# Patient Record
Sex: Female | Born: 1935 | Race: Black or African American | Hispanic: No | Smoking: Never smoker
Health system: Southern US, Community
[De-identification: ages and names within clinical notes are randomized; demographics above are authoritative.]

## PROBLEM LIST (undated history)

## (undated) DIAGNOSIS — M1990 Unspecified osteoarthritis, unspecified site: Secondary | ICD-10-CM

## (undated) DIAGNOSIS — I10 Essential (primary) hypertension: Secondary | ICD-10-CM

## (undated) DIAGNOSIS — E669 Obesity, unspecified: Secondary | ICD-10-CM

## (undated) DIAGNOSIS — D561 Beta thalassemia: Secondary | ICD-10-CM

## (undated) DIAGNOSIS — I639 Cerebral infarction, unspecified: Secondary | ICD-10-CM

## (undated) HISTORY — PX: CATARACT EXTRACTION: SUR2

## (undated) HISTORY — PX: APPENDECTOMY: SHX54

## (undated) HISTORY — PX: ABDOMINAL HYSTERECTOMY: SHX81

## (undated) HISTORY — PX: FOOT SURGERY: SHX648

---

## 2003-08-25 ENCOUNTER — Emergency Department (HOSPITAL_COMMUNITY): Admission: AD | Admit: 2003-08-25 | Discharge: 2003-08-25 | Payer: Self-pay | Admitting: Family Medicine

## 2007-05-11 ENCOUNTER — Encounter: Admission: RE | Admit: 2007-05-11 | Discharge: 2007-05-11 | Payer: Self-pay | Admitting: *Deleted

## 2007-05-15 ENCOUNTER — Encounter: Admission: RE | Admit: 2007-05-15 | Discharge: 2007-05-15 | Payer: Self-pay | Admitting: *Deleted

## 2008-06-13 ENCOUNTER — Encounter: Admission: RE | Admit: 2008-06-13 | Discharge: 2008-06-13 | Payer: Self-pay | Admitting: Family Medicine

## 2011-07-29 ENCOUNTER — Ambulatory Visit
Admission: RE | Admit: 2011-07-29 | Discharge: 2011-07-29 | Disposition: A | Discharge status: Home / Self Care | Payer: Medicare HMO | Source: Ambulatory Visit | Attending: Podiatry | Admitting: Podiatry

## 2011-07-29 ENCOUNTER — Other Ambulatory Visit: Payer: Self-pay | Admitting: Podiatry

## 2011-07-29 DIAGNOSIS — M79673 Pain in unspecified foot: Secondary | ICD-10-CM

## 2011-08-08 DIAGNOSIS — Z0000 Encounter for general adult medical examination without abnormal findings: Secondary | ICD-10-CM | POA: Insufficient documentation

## 2013-04-17 ENCOUNTER — Other Ambulatory Visit: Payer: Self-pay | Admitting: Family Medicine

## 2013-04-17 ENCOUNTER — Ambulatory Visit
Admission: RE | Admit: 2013-04-17 | Discharge: 2013-04-17 | Disposition: A | Discharge status: Home / Self Care | Payer: Medicare HMO | Source: Ambulatory Visit | Attending: Family Medicine | Admitting: Family Medicine

## 2013-04-17 DIAGNOSIS — R05 Cough: Secondary | ICD-10-CM

## 2013-04-18 ENCOUNTER — Other Ambulatory Visit: Payer: Self-pay | Admitting: Family Medicine

## 2013-04-18 DIAGNOSIS — R9389 Abnormal findings on diagnostic imaging of other specified body structures: Secondary | ICD-10-CM

## 2013-04-23 ENCOUNTER — Ambulatory Visit
Admission: RE | Admit: 2013-04-23 | Discharge: 2013-04-23 | Disposition: A | Discharge status: Home / Self Care | Payer: Medicare HMO | Source: Ambulatory Visit | Attending: Family Medicine | Admitting: Family Medicine

## 2013-04-23 DIAGNOSIS — R9389 Abnormal findings on diagnostic imaging of other specified body structures: Secondary | ICD-10-CM

## 2013-04-23 MED ORDER — IOHEXOL 300 MG/ML  SOLN
75.0000 mL | Freq: Once | INTRAMUSCULAR | Status: AC | PRN
  Administered 2013-04-23: 75 mL via INTRAVENOUS

## 2014-04-07 ENCOUNTER — Other Ambulatory Visit: Payer: Self-pay | Admitting: Family Medicine

## 2014-04-07 DIAGNOSIS — R911 Solitary pulmonary nodule: Secondary | ICD-10-CM

## 2014-04-24 ENCOUNTER — Other Ambulatory Visit: Payer: Medicare HMO

## 2014-05-01 ENCOUNTER — Ambulatory Visit
Admission: RE | Admit: 2014-05-01 | Discharge: 2014-05-01 | Disposition: A | Discharge status: Home / Self Care | Payer: Commercial Managed Care - HMO | Source: Ambulatory Visit | Attending: Family Medicine | Admitting: Family Medicine

## 2014-05-01 DIAGNOSIS — R911 Solitary pulmonary nodule: Secondary | ICD-10-CM

## 2014-05-06 ENCOUNTER — Other Ambulatory Visit: Payer: Self-pay | Admitting: Family Medicine

## 2014-05-06 DIAGNOSIS — E041 Nontoxic single thyroid nodule: Secondary | ICD-10-CM

## 2014-05-12 ENCOUNTER — Ambulatory Visit
Admission: RE | Admit: 2014-05-12 | Discharge: 2014-05-12 | Disposition: A | Discharge status: Home / Self Care | Payer: Commercial Managed Care - HMO | Source: Ambulatory Visit | Attending: Family Medicine | Admitting: Family Medicine

## 2014-05-12 DIAGNOSIS — E041 Nontoxic single thyroid nodule: Secondary | ICD-10-CM

## 2014-05-13 ENCOUNTER — Other Ambulatory Visit: Payer: Self-pay | Admitting: Family Medicine

## 2014-05-13 DIAGNOSIS — E041 Nontoxic single thyroid nodule: Secondary | ICD-10-CM

## 2014-05-21 ENCOUNTER — Ambulatory Visit
Admission: RE | Admit: 2014-05-21 | Discharge: 2014-05-21 | Disposition: A | Discharge status: Home / Self Care | Payer: Commercial Managed Care - HMO | Source: Ambulatory Visit | Attending: Family Medicine | Admitting: Family Medicine

## 2014-05-21 ENCOUNTER — Other Ambulatory Visit: Payer: Self-pay | Admitting: Family Medicine

## 2014-05-21 DIAGNOSIS — E041 Nontoxic single thyroid nodule: Secondary | ICD-10-CM

## 2014-07-09 ENCOUNTER — Other Ambulatory Visit: Payer: Self-pay

## 2014-07-09 DIAGNOSIS — Z1231 Encounter for screening mammogram for malignant neoplasm of breast: Secondary | ICD-10-CM

## 2014-08-05 ENCOUNTER — Ambulatory Visit
Admission: RE | Admit: 2014-08-05 | Discharge: 2014-08-05 | Disposition: A | Discharge status: Home / Self Care | Payer: Commercial Managed Care - HMO | Source: Ambulatory Visit

## 2014-08-05 DIAGNOSIS — Z1231 Encounter for screening mammogram for malignant neoplasm of breast: Secondary | ICD-10-CM

## 2014-09-02 DIAGNOSIS — N3941 Urge incontinence: Secondary | ICD-10-CM | POA: Diagnosis not present

## 2014-09-02 DIAGNOSIS — B373 Candidiasis of vulva and vagina: Secondary | ICD-10-CM | POA: Diagnosis not present

## 2014-09-02 DIAGNOSIS — N907 Vulvar cyst: Secondary | ICD-10-CM | POA: Diagnosis not present

## 2014-09-02 DIAGNOSIS — N762 Acute vulvitis: Secondary | ICD-10-CM | POA: Diagnosis not present

## 2014-09-25 DIAGNOSIS — Z23 Encounter for immunization: Secondary | ICD-10-CM | POA: Diagnosis not present

## 2014-09-25 DIAGNOSIS — R7309 Other abnormal glucose: Secondary | ICD-10-CM | POA: Diagnosis not present

## 2014-09-25 DIAGNOSIS — E785 Hyperlipidemia, unspecified: Secondary | ICD-10-CM | POA: Diagnosis not present

## 2014-09-25 DIAGNOSIS — D563 Thalassemia minor: Secondary | ICD-10-CM | POA: Diagnosis not present

## 2014-09-25 DIAGNOSIS — E049 Nontoxic goiter, unspecified: Secondary | ICD-10-CM | POA: Diagnosis not present

## 2014-09-25 DIAGNOSIS — I10 Essential (primary) hypertension: Secondary | ICD-10-CM | POA: Diagnosis not present

## 2014-09-29 DIAGNOSIS — R7309 Other abnormal glucose: Secondary | ICD-10-CM | POA: Diagnosis not present

## 2014-09-29 DIAGNOSIS — D563 Thalassemia minor: Secondary | ICD-10-CM | POA: Diagnosis not present

## 2014-10-23 DIAGNOSIS — S60012A Contusion of left thumb without damage to nail, initial encounter: Secondary | ICD-10-CM | POA: Diagnosis not present

## 2014-11-04 DIAGNOSIS — H5210 Myopia, unspecified eye: Secondary | ICD-10-CM | POA: Diagnosis not present

## 2014-12-04 DIAGNOSIS — H2511 Age-related nuclear cataract, right eye: Secondary | ICD-10-CM | POA: Diagnosis not present

## 2014-12-04 DIAGNOSIS — H02839 Dermatochalasis of unspecified eye, unspecified eyelid: Secondary | ICD-10-CM | POA: Diagnosis not present

## 2014-12-04 DIAGNOSIS — H18412 Arcus senilis, left eye: Secondary | ICD-10-CM | POA: Diagnosis not present

## 2014-12-04 DIAGNOSIS — H18411 Arcus senilis, right eye: Secondary | ICD-10-CM | POA: Diagnosis not present

## 2015-04-20 DIAGNOSIS — E785 Hyperlipidemia, unspecified: Secondary | ICD-10-CM | POA: Diagnosis not present

## 2015-04-20 DIAGNOSIS — R7309 Other abnormal glucose: Secondary | ICD-10-CM | POA: Diagnosis not present

## 2015-04-20 DIAGNOSIS — I10 Essential (primary) hypertension: Secondary | ICD-10-CM | POA: Diagnosis not present

## 2015-04-20 DIAGNOSIS — E049 Nontoxic goiter, unspecified: Secondary | ICD-10-CM | POA: Diagnosis not present

## 2015-04-20 DIAGNOSIS — Z0000 Encounter for general adult medical examination without abnormal findings: Secondary | ICD-10-CM | POA: Diagnosis not present

## 2015-04-20 DIAGNOSIS — M25562 Pain in left knee: Secondary | ICD-10-CM | POA: Diagnosis not present

## 2015-04-20 DIAGNOSIS — N951 Menopausal and female climacteric states: Secondary | ICD-10-CM | POA: Diagnosis not present

## 2015-04-21 ENCOUNTER — Other Ambulatory Visit: Payer: Self-pay | Admitting: Family Medicine

## 2015-04-21 DIAGNOSIS — E049 Nontoxic goiter, unspecified: Secondary | ICD-10-CM

## 2015-04-23 DIAGNOSIS — H18412 Arcus senilis, left eye: Secondary | ICD-10-CM | POA: Diagnosis not present

## 2015-04-23 DIAGNOSIS — H2511 Age-related nuclear cataract, right eye: Secondary | ICD-10-CM | POA: Diagnosis not present

## 2015-04-23 DIAGNOSIS — H18411 Arcus senilis, right eye: Secondary | ICD-10-CM | POA: Diagnosis not present

## 2015-04-23 DIAGNOSIS — H02839 Dermatochalasis of unspecified eye, unspecified eyelid: Secondary | ICD-10-CM | POA: Diagnosis not present

## 2015-04-30 ENCOUNTER — Other Ambulatory Visit: Payer: Commercial Managed Care - HMO

## 2015-05-19 DIAGNOSIS — M8589 Other specified disorders of bone density and structure, multiple sites: Secondary | ICD-10-CM | POA: Diagnosis not present

## 2015-05-19 DIAGNOSIS — Z780 Asymptomatic menopausal state: Secondary | ICD-10-CM | POA: Diagnosis not present

## 2015-06-10 DIAGNOSIS — H2511 Age-related nuclear cataract, right eye: Secondary | ICD-10-CM | POA: Diagnosis not present

## 2015-06-10 DIAGNOSIS — H269 Unspecified cataract: Secondary | ICD-10-CM | POA: Diagnosis not present

## 2015-06-10 DIAGNOSIS — I10 Essential (primary) hypertension: Secondary | ICD-10-CM | POA: Diagnosis not present

## 2015-06-10 DIAGNOSIS — Z886 Allergy status to analgesic agent status: Secondary | ICD-10-CM | POA: Diagnosis not present

## 2015-06-10 DIAGNOSIS — Z961 Presence of intraocular lens: Secondary | ICD-10-CM | POA: Diagnosis not present

## 2015-06-10 DIAGNOSIS — Z6839 Body mass index (BMI) 39.0-39.9, adult: Secondary | ICD-10-CM | POA: Diagnosis not present

## 2015-06-10 DIAGNOSIS — Z87891 Personal history of nicotine dependence: Secondary | ICD-10-CM | POA: Diagnosis not present

## 2015-06-10 DIAGNOSIS — E669 Obesity, unspecified: Secondary | ICD-10-CM | POA: Diagnosis not present

## 2015-06-11 DIAGNOSIS — H2512 Age-related nuclear cataract, left eye: Secondary | ICD-10-CM | POA: Diagnosis not present

## 2015-06-18 DIAGNOSIS — H2510 Age-related nuclear cataract, unspecified eye: Secondary | ICD-10-CM | POA: Insufficient documentation

## 2015-06-24 DIAGNOSIS — Z886 Allergy status to analgesic agent status: Secondary | ICD-10-CM | POA: Diagnosis not present

## 2015-06-24 DIAGNOSIS — Z87891 Personal history of nicotine dependence: Secondary | ICD-10-CM | POA: Diagnosis not present

## 2015-06-24 DIAGNOSIS — Z79899 Other long term (current) drug therapy: Secondary | ICD-10-CM | POA: Diagnosis not present

## 2015-06-24 DIAGNOSIS — E669 Obesity, unspecified: Secondary | ICD-10-CM | POA: Diagnosis not present

## 2015-06-24 DIAGNOSIS — H2512 Age-related nuclear cataract, left eye: Secondary | ICD-10-CM | POA: Diagnosis not present

## 2015-06-24 DIAGNOSIS — H269 Unspecified cataract: Secondary | ICD-10-CM | POA: Diagnosis not present

## 2015-06-24 DIAGNOSIS — E785 Hyperlipidemia, unspecified: Secondary | ICD-10-CM | POA: Diagnosis not present

## 2015-06-24 DIAGNOSIS — Z961 Presence of intraocular lens: Secondary | ICD-10-CM | POA: Diagnosis not present

## 2015-06-24 DIAGNOSIS — Z6841 Body Mass Index (BMI) 40.0 and over, adult: Secondary | ICD-10-CM | POA: Diagnosis not present

## 2015-06-24 DIAGNOSIS — I10 Essential (primary) hypertension: Secondary | ICD-10-CM | POA: Diagnosis not present

## 2015-07-03 DIAGNOSIS — M1712 Unilateral primary osteoarthritis, left knee: Secondary | ICD-10-CM | POA: Diagnosis not present

## 2015-07-07 ENCOUNTER — Ambulatory Visit
Admission: RE | Admit: 2015-07-07 | Discharge: 2015-07-07 | Disposition: A | Discharge status: Home / Self Care | Payer: Commercial Managed Care - HMO | Source: Ambulatory Visit | Attending: Family Medicine | Admitting: Family Medicine

## 2015-07-07 DIAGNOSIS — E041 Nontoxic single thyroid nodule: Secondary | ICD-10-CM | POA: Diagnosis not present

## 2015-07-07 DIAGNOSIS — E049 Nontoxic goiter, unspecified: Secondary | ICD-10-CM

## 2015-09-17 DIAGNOSIS — M79674 Pain in right toe(s): Secondary | ICD-10-CM | POA: Diagnosis not present

## 2015-09-17 DIAGNOSIS — L03031 Cellulitis of right toe: Secondary | ICD-10-CM | POA: Diagnosis not present

## 2015-09-23 ENCOUNTER — Other Ambulatory Visit: Payer: Self-pay | Admitting: Obstetrics and Gynecology

## 2015-09-23 ENCOUNTER — Other Ambulatory Visit: Payer: Self-pay

## 2015-09-23 DIAGNOSIS — Z1231 Encounter for screening mammogram for malignant neoplasm of breast: Secondary | ICD-10-CM

## 2015-10-02 DIAGNOSIS — B351 Tinea unguium: Secondary | ICD-10-CM | POA: Diagnosis not present

## 2015-10-02 DIAGNOSIS — M79675 Pain in left toe(s): Secondary | ICD-10-CM | POA: Diagnosis not present

## 2015-10-02 DIAGNOSIS — M79674 Pain in right toe(s): Secondary | ICD-10-CM | POA: Diagnosis not present

## 2015-10-29 ENCOUNTER — Ambulatory Visit: Payer: Commercial Managed Care - HMO

## 2015-11-11 DIAGNOSIS — E785 Hyperlipidemia, unspecified: Secondary | ICD-10-CM | POA: Diagnosis not present

## 2015-11-11 DIAGNOSIS — I10 Essential (primary) hypertension: Secondary | ICD-10-CM | POA: Diagnosis not present

## 2015-11-11 DIAGNOSIS — Z23 Encounter for immunization: Secondary | ICD-10-CM | POA: Diagnosis not present

## 2015-11-11 DIAGNOSIS — R7309 Other abnormal glucose: Secondary | ICD-10-CM | POA: Diagnosis not present

## 2015-11-18 ENCOUNTER — Ambulatory Visit
Admission: RE | Admit: 2015-11-18 | Discharge: 2015-11-18 | Disposition: A | Discharge status: Home / Self Care | Payer: Commercial Managed Care - HMO | Source: Ambulatory Visit

## 2015-11-18 DIAGNOSIS — Z1231 Encounter for screening mammogram for malignant neoplasm of breast: Secondary | ICD-10-CM

## 2015-12-08 ENCOUNTER — Inpatient Hospital Stay (HOSPITAL_COMMUNITY)
Admission: EM | Admit: 2015-12-08 | Discharge: 2015-12-10 | DRG: 066 | Disposition: A | Discharge status: Home / Self Care | Payer: Commercial Managed Care - HMO | Attending: Internal Medicine | Admitting: Internal Medicine

## 2015-12-08 ENCOUNTER — Encounter (HOSPITAL_COMMUNITY): Payer: Self-pay | Admitting: *Deleted

## 2015-12-08 ENCOUNTER — Emergency Department (HOSPITAL_COMMUNITY): Payer: Commercial Managed Care - HMO

## 2015-12-08 DIAGNOSIS — R499 Unspecified voice and resonance disorder: Secondary | ICD-10-CM | POA: Diagnosis not present

## 2015-12-08 DIAGNOSIS — R29810 Facial weakness: Secondary | ICD-10-CM | POA: Diagnosis not present

## 2015-12-08 DIAGNOSIS — I739 Peripheral vascular disease, unspecified: Secondary | ICD-10-CM | POA: Diagnosis present

## 2015-12-08 DIAGNOSIS — I639 Cerebral infarction, unspecified: Secondary | ICD-10-CM | POA: Diagnosis not present

## 2015-12-08 DIAGNOSIS — E785 Hyperlipidemia, unspecified: Secondary | ICD-10-CM | POA: Diagnosis present

## 2015-12-08 DIAGNOSIS — Z6838 Body mass index (BMI) 38.0-38.9, adult: Secondary | ICD-10-CM

## 2015-12-08 DIAGNOSIS — Z87891 Personal history of nicotine dependence: Secondary | ICD-10-CM

## 2015-12-08 DIAGNOSIS — E669 Obesity, unspecified: Secondary | ICD-10-CM | POA: Diagnosis present

## 2015-12-08 DIAGNOSIS — R471 Dysarthria and anarthria: Secondary | ICD-10-CM | POA: Diagnosis present

## 2015-12-08 DIAGNOSIS — R7303 Prediabetes: Secondary | ICD-10-CM | POA: Diagnosis not present

## 2015-12-08 DIAGNOSIS — R05 Cough: Secondary | ICD-10-CM | POA: Diagnosis not present

## 2015-12-08 DIAGNOSIS — E876 Hypokalemia: Secondary | ICD-10-CM | POA: Diagnosis present

## 2015-12-08 DIAGNOSIS — Z79899 Other long term (current) drug therapy: Secondary | ICD-10-CM | POA: Diagnosis not present

## 2015-12-08 DIAGNOSIS — J811 Chronic pulmonary edema: Secondary | ICD-10-CM | POA: Diagnosis not present

## 2015-12-08 DIAGNOSIS — I638 Other cerebral infarction: Secondary | ICD-10-CM | POA: Diagnosis not present

## 2015-12-08 HISTORY — DX: Obesity, unspecified: E66.9

## 2015-12-08 LAB — COMPREHENSIVE METABOLIC PANEL
ALK PHOS: 58 U/L (ref 38–126)
ALT: 17 U/L (ref 14–54)
AST: 21 U/L (ref 15–41)
Albumin: 4.1 g/dL (ref 3.5–5.0)
Anion gap: 9 (ref 5–15)
BILIRUBIN TOTAL: 0.7 mg/dL (ref 0.3–1.2)
BUN: 16 mg/dL (ref 6–20)
CALCIUM: 9.6 mg/dL (ref 8.9–10.3)
CO2: 28 mmol/L (ref 22–32)
CREATININE: 0.68 mg/dL (ref 0.44–1.00)
Chloride: 102 mmol/L (ref 101–111)
Glucose, Bld: 102 mg/dL — ABNORMAL HIGH (ref 65–99)
Potassium: 3.7 mmol/L (ref 3.5–5.1)
Sodium: 139 mmol/L (ref 135–145)
TOTAL PROTEIN: 7.4 g/dL (ref 6.5–8.1)

## 2015-12-08 LAB — DIFFERENTIAL
BASOS PCT: 0 %
Basophils Absolute: 0 10*3/uL (ref 0.0–0.1)
Eosinophils Absolute: 0.1 10*3/uL (ref 0.0–0.7)
Eosinophils Relative: 1 %
LYMPHS PCT: 30 %
Lymphs Abs: 1.9 10*3/uL (ref 0.7–4.0)
MONOS PCT: 10 %
Monocytes Absolute: 0.6 10*3/uL (ref 0.1–1.0)
NEUTROS ABS: 3.7 10*3/uL (ref 1.7–7.7)
Neutrophils Relative %: 59 %

## 2015-12-08 LAB — CBC
HCT: 37.3 % (ref 36.0–46.0)
HEMOGLOBIN: 11.8 g/dL — AB (ref 12.0–15.0)
MCH: 19.5 pg — AB (ref 26.0–34.0)
MCHC: 31.6 g/dL (ref 30.0–36.0)
MCV: 61.6 fL — AB (ref 78.0–100.0)
Platelets: 247 10*3/uL (ref 150–400)
RBC: 6.06 MIL/uL — AB (ref 3.87–5.11)
RDW: 15.9 % — ABNORMAL HIGH (ref 11.5–15.5)
WBC: 6.3 10*3/uL (ref 4.0–10.5)

## 2015-12-08 LAB — APTT: aPTT: 28 seconds (ref 24–37)

## 2015-12-08 LAB — PROTIME-INR
INR: 1.08 (ref 0.00–1.49)
PROTHROMBIN TIME: 14.2 s (ref 11.6–15.2)

## 2015-12-08 LAB — I-STAT TROPONIN, ED: TROPONIN I, POC: 0 ng/mL (ref 0.00–0.08)

## 2015-12-08 NOTE — ED Provider Notes (Signed)
CSN: [id number]     Arrival date & time 12/08/15  1755 History   First MD Initiated Contact with Patient 12/08/15 2018     Chief Complaint  Patient presents with  . Aphasia     (Consider location/radiation/quality/duration/timing/severity/associated sxs/prior Treatment) HPI Patient presents to the emergency department with difficulty speaking and slurred speech yesterday morning.  The patient states that she felt like she had some thickness in her throat and family noticed that she was slurring her speech.  The patient states she has no difficulty ambulating and no weakness in her extremities, states nothing seems make the condition better or worse. The patient denies chest pain, shortness of breath, headache,blurred vision, neck pain, fever, cough, weakness, numbness, dizziness, anorexia, edema, abdominal pain, nausea, vomiting, diarrhea, rash, back pain, dysuria, hematemesis, bloody stool, near syncope, or syncope.  The patient states she did not take any medications prior to arrival for her symptoms Past Medical History  Diagnosis Date  . Obesity    Past Surgical History  Procedure Laterality Date  . Appendectomy    . Abdominal hysterectomy    . Foot surgery     No family history on file. Social History  Substance Use Topics  . Smoking status: Never Smoker   . Smokeless tobacco: None  . Alcohol Use: No   OB History    No data available     Review of Systems All other systems negative except as documented in the HPI. All pertinent positives and negatives as reviewed in the HPI.   Allergies  Aspirin  Home Medications   Prior to Admission medications   Medication Sig Start Date End Date Taking? Authorizing Provider  guaiFENesin (ROBITUSSIN) 100 MG/5ML liquid Take 200 mg by mouth 3 (three) times daily as needed for cough or congestion.   Yes Historical Provider, MD  Multiple Vitamin (THERA) TABS Take 1 tablet by mouth daily.   Yes Historical Provider, MD  simvastatin  (ZOCOR) 20 MG tablet Take 20 mg by mouth daily. 11/12/15  Yes Historical Provider, MD  valsartan-hydrochlorothiazide (DIOVAN-HCT) 320-25 MG tablet Take 1 tablet by mouth daily. 11/12/15  Yes Historical Provider, MD   BP 158/90 mmHg  Pulse 73  Temp(Src) 98 F (36.7 C) (Oral)  Resp 20  Ht 5\' 4"  (1.626 m)  Wt 101.152 kg  BMI 38.26 kg/m2  SpO2 97% Physical Exam  Constitutional: She is oriented to person, place, and time. She appears well-developed and well-nourished. No distress.  HENT:  Head: Normocephalic and atraumatic.  Mouth/Throat: Oropharynx is clear and moist.  Eyes: Pupils are equal, round, and reactive to light.  Neck: Normal range of motion. Neck supple.  Cardiovascular: Normal rate, regular rhythm and normal heart sounds.  Exam reveals no gallop and no friction rub.   No murmur heard. Pulmonary/Chest: Effort normal and breath sounds normal. No respiratory distress. She has no wheezes.  Abdominal: Soft. Bowel sounds are normal. She exhibits no distension. There is no tenderness.  Neurological: She is alert and oriented to person, place, and time. She has normal strength. No sensory deficit. She exhibits normal muscle tone. Coordination and gait normal. GCS eye subscore is 4. GCS verbal subscore is 5. GCS motor subscore is 6.  Patient has left facial droop at the corner of her left mouth  Skin: Skin is warm and dry. No rash noted. No erythema.  Psychiatric: She has a normal mood and affect. Her behavior is normal.  Nursing note and vitals reviewed.   ED Course  Procedures (  including critical care time) Labs Review Labs Reviewed  CBC - Abnormal; Notable for the following:    RBC 6.06 (*)    Hemoglobin 11.8 (*)    MCV 61.6 (*)    MCH 19.5 (*)    RDW 15.9 (*)    All other components within normal limits  COMPREHENSIVE METABOLIC PANEL - Abnormal; Notable for the following:    Glucose, Bld 102 (*)    All other components within normal limits  PROTIME-INR  APTT   DIFFERENTIAL  Rosezena Sensor, ED    Imaging Review Mr Brain Wo Contrast  12/08/2015  CLINICAL DATA:  Slurred speech beginning yesterday at 11 a.m. History of obesity. EXAM: MRI HEAD WITHOUT CONTRAST TECHNIQUE: Multiplanar, multiecho pulse sequences of the brain and surrounding structures were obtained without intravenous contrast. COMPARISON:  None. FINDINGS: INTRACRANIAL CONTENTS: 13 x 6 mm focus of reduced diffusion LEFT posterior frontal lobe subcortical white matter with low ADC value, no significant FLAIR abnormality. Punctate focus of susceptibility artifact in LEFT frontal precentral gyrus is nonspecific. The ventricles and sulci are normal for patient's age. Multiple small cerebellar infarcts. Patchy supratentorial white matter FLAIR T2 hyperintensities. No suspicious parenchymal signal, mass lesions, mass effect. No abnormal extra-axial fluid collections. No extra-axial masses though, contrast enhanced sequences would be more sensitive. Major intracranial vascular flow voids present at skull base, dolichoectasia most compatible with chronic hypertension. ORBITS: The included ocular globes and orbital contents are non-suspicious. Status post bilateral ocular lens implants. Probable colobomas bilaterally. SINUSES: The mastoid air-cells and included paranasal sinuses are well-aerated. SKULL/SOFT TISSUES: No abnormal sellar expansion. No suspicious calvarial bone marrow signal. Craniocervical junction maintained. Patient is edentulous. IMPRESSION: Acute small LEFT frontal lobe infarct. Moderate white matter changes compatible with chronic small vessel ischemic disease. Multiple old small cerebellar infarcts. Electronically Signed   By: Awilda Metro M.D.   On: 12/08/2015 22:44   I have personally reviewed and evaluated these images and lab results as part of my medical decision-making.   EKG Interpretation None      The patient was noted to have slurring of her speech on examination.  She  also was noted to have some facial droop in the left corner of her mouth.  Patient be admitted to the hospital and spoke with the neuro hospitalist along with the [facility].   Charlestine Night, PA-C 12/10/15 2222  Mancel Bale, MD 12/14/15 [phone]

## 2015-12-08 NOTE — ED Notes (Addendum)
Pt taken to MRI  

## 2015-12-08 NOTE — ED Provider Notes (Signed)
  Face-to-face evaluation   History: She c/o thick feeling in throat and difficulty talking since yesterday. No HA or chest pain,  No syncope.  Physical exam: Alert elderly female. No consistant facial weakness. No dysarthria  Medical screening examination/treatment/procedure(s) were conducted as a shared visit with non-physician practitioner(s) and myself.  I personally evaluated the patient during the encounter  Mancel BaleElliott Christene Pounds, MD 12/14/15 1315

## 2015-12-08 NOTE — ED Notes (Signed)
Pt is here for slurred speech that began yesterday am around 11am.  Pt denies any other neuro symptoms.  Pt states that her face looks as it usually does.  No focal weakness or numbness.  Pt is alert and oriented, speech slurred

## 2015-12-09 ENCOUNTER — Inpatient Hospital Stay (HOSPITAL_COMMUNITY): Payer: Commercial Managed Care - HMO

## 2015-12-09 DIAGNOSIS — R7303 Prediabetes: Secondary | ICD-10-CM | POA: Diagnosis present

## 2015-12-09 DIAGNOSIS — R29810 Facial weakness: Secondary | ICD-10-CM | POA: Diagnosis present

## 2015-12-09 DIAGNOSIS — I639 Cerebral infarction, unspecified: Secondary | ICD-10-CM | POA: Diagnosis present

## 2015-12-09 DIAGNOSIS — E876 Hypokalemia: Secondary | ICD-10-CM

## 2015-12-09 DIAGNOSIS — Z79899 Other long term (current) drug therapy: Secondary | ICD-10-CM | POA: Diagnosis not present

## 2015-12-09 DIAGNOSIS — Z87891 Personal history of nicotine dependence: Secondary | ICD-10-CM | POA: Diagnosis not present

## 2015-12-09 DIAGNOSIS — R471 Dysarthria and anarthria: Secondary | ICD-10-CM

## 2015-12-09 DIAGNOSIS — E785 Hyperlipidemia, unspecified: Secondary | ICD-10-CM | POA: Diagnosis present

## 2015-12-09 DIAGNOSIS — I739 Peripheral vascular disease, unspecified: Secondary | ICD-10-CM | POA: Diagnosis present

## 2015-12-09 DIAGNOSIS — Z6838 Body mass index (BMI) 38.0-38.9, adult: Secondary | ICD-10-CM | POA: Diagnosis not present

## 2015-12-09 DIAGNOSIS — E669 Obesity, unspecified: Secondary | ICD-10-CM | POA: Diagnosis present

## 2015-12-09 LAB — CBC WITH DIFFERENTIAL/PLATELET
BASOS ABS: 0 10*3/uL (ref 0.0–0.1)
BASOS PCT: 0 %
EOS ABS: 0.1 10*3/uL (ref 0.0–0.7)
Eosinophils Relative: 1 %
HCT: 34.3 % — ABNORMAL LOW (ref 36.0–46.0)
Hemoglobin: 10.3 g/dL — ABNORMAL LOW (ref 12.0–15.0)
Lymphocytes Relative: 34 %
Lymphs Abs: 2.1 10*3/uL (ref 0.7–4.0)
MCH: 18.3 pg — AB (ref 26.0–34.0)
MCHC: 30 g/dL (ref 30.0–36.0)
MCV: 60.9 fL — ABNORMAL LOW (ref 78.0–100.0)
MONO ABS: 0.8 10*3/uL (ref 0.1–1.0)
Monocytes Relative: 12 %
NEUTROS PCT: 53 %
Neutro Abs: 3.3 10*3/uL (ref 1.7–7.7)
PLATELETS: 235 10*3/uL (ref 150–400)
RBC: 5.63 MIL/uL — AB (ref 3.87–5.11)
RDW: 15.7 % — ABNORMAL HIGH (ref 11.5–15.5)
WBC: 6.3 10*3/uL (ref 4.0–10.5)

## 2015-12-09 LAB — BASIC METABOLIC PANEL
Anion gap: 7 (ref 5–15)
BUN: 14 mg/dL (ref 6–20)
CO2: 31 mmol/L (ref 22–32)
Calcium: 9.1 mg/dL (ref 8.9–10.3)
Chloride: 101 mmol/L (ref 101–111)
Creatinine, Ser: 0.77 mg/dL (ref 0.44–1.00)
Glucose, Bld: 108 mg/dL — ABNORMAL HIGH (ref 65–99)
POTASSIUM: 3 mmol/L — AB (ref 3.5–5.1)
SODIUM: 139 mmol/L (ref 135–145)

## 2015-12-09 LAB — LIPID PANEL
CHOL/HDL RATIO: 3.3 ratio
CHOLESTEROL: 179 mg/dL (ref 0–200)
HDL: 55 mg/dL (ref >40)
LDL CALC: 97 mg/dL (ref 0–99)
Triglycerides: 135 mg/dL (ref <150)
VLDL: 27 mg/dL (ref 0–40)

## 2015-12-09 MED ORDER — POTASSIUM CHLORIDE CRYS ER 20 MEQ PO TBCR
20.0000 meq | EXTENDED_RELEASE_TABLET | Freq: Three times a day (TID) | ORAL | Status: AC
  Administered 2015-12-09 (×3): 20 meq via ORAL
  Filled 2015-12-09 (×3): qty 1

## 2015-12-09 MED ORDER — ENOXAPARIN SODIUM 40 MG/0.4ML ~~LOC~~ SOLN
40.0000 mg | SUBCUTANEOUS | Status: DC
  Administered 2015-12-09 – 2015-12-10 (×2): 40 mg via SUBCUTANEOUS
  Filled 2015-12-09 (×2): qty 0.4

## 2015-12-09 MED ORDER — STROKE: EARLY STAGES OF RECOVERY BOOK
Freq: Once | Status: AC
  Administered 2015-12-09: 1
  Filled 2015-12-09: qty 1

## 2015-12-09 MED ORDER — HYDRALAZINE HCL 20 MG/ML IJ SOLN: 10.0000 mg | Freq: Three times a day (TID) | INTRAMUSCULAR | Status: DC | PRN

## 2015-12-09 MED ORDER — ATORVASTATIN CALCIUM 40 MG PO TABS
40.0000 mg | ORAL_TABLET | Freq: Every day | ORAL | Status: DC
  Administered 2015-12-09: 40 mg via ORAL
  Filled 2015-12-09: qty 1

## 2015-12-09 MED ORDER — CLOPIDOGREL BISULFATE 75 MG PO TABS
75.0000 mg | ORAL_TABLET | Freq: Every day | ORAL | Status: DC
  Administered 2015-12-09 – 2015-12-10 (×3): 75 mg via ORAL
  Filled 2015-12-09 (×3): qty 1

## 2015-12-09 MED ORDER — SENNOSIDES-DOCUSATE SODIUM 8.6-50 MG PO TABS: 1.0000 | ORAL_TABLET | Freq: Every evening | ORAL | Status: DC | PRN

## 2015-12-09 NOTE — Consult Note (Addendum)
Neurology Consultation Reason for Consult: Stroke Referring Physician: Ed attending  CC: slurred speech  History is obtained from patient  HPI: Stacy Levy is a 82 y.o. female with hx of obesity and not on asa at baseline because of stomach upset.  She was speaking with her daughter 2 days ago on the phone and suddenly developed slurred speech but decided to wait.  Her speech did not improve by yesterday and she made appt with her PMD who sent her to the ED when he saw her in the office.  She denies arm or leg weakness, changes in gait, difficulty swallowing.  Denies diplopia, arm and hand clumsiness.   LKW: 3 days ago tpa given?: no  ROS: A 14 point ROS was performed and is negative except as noted in the HPI.  Past Medical History  Diagnosis Date  . Obesity     No family history on file.   Social History:  reports that she has never smoked. She does not have any smokeless tobacco history on file. She reports that she does not drink alcohol. Her drug history is not on file.   Exam: Current vital signs: BP 150/72 mmHg  Pulse 65  Temp(Src) 98 F (36.7 C) (Oral)  Resp 20  Ht 5\' 4"  (1.626 m)  Wt 101.152 kg (223 lb)  BMI 38.26 kg/m2  SpO2 98% Vital signs in last 24 hours: Temp:  [98 F (36.7 C)-98.4 F (36.9 C)] 98 F (36.7 C) (04/18 2322) Pulse Rate:  [65-73] 65 (04/19 0127) Resp:  [20] 20 (04/19 0127) BP: (150-158)/(72-90) 150/72 mmHg (04/19 0127) SpO2:  [97 %-98 %] 98 % (04/19 0127) Weight:  [101.152 kg (223 lb)] 101.152 kg (223 lb) (04/18 1811)   Physical Exam  Constitutional: Appears well-developed and well-nourished.  Psych: Affect appropriate to situation Eyes: No scleral injection HENT: No OP obstrucion Head: Normocephalic.  Cardiovascular: Normal rate and regular rhythm.  Respiratory: Effort normal and breath sounds normal to anterior ascultation GI: Soft.  No distension. There is no tenderness.  Skin: WDI  Neuro: Mental Status: Patient is awake,  alert, oriented to person, place, month, year, and situation. Patient is able to give a clear and coherent history. No signs of aphasia or neglect Cranial Nerves: II: Visual Fields are full. Pupils are equal, round, and reactive to light. III,IV, VI: EOMI without ptosis or diploplia.  V: Facial sensation is asymmetric, on right  VII: Facial movement is asymmetric on right  VIII: hearing is intact to voice X: Uvula elevates symmetrically XI: Shoulder shrug is symmetric. XII: tongue is midline without atrophy or fasciculations. She has lingual dysarthria Motor: Tone is normal. Bulk is normal. 5/5 strength was present in all four extremities. Sensory: Sensation is symmetric to light touch and temperature in the arms and legs Deep Tendon Reflexes: 2+ and symmetric in the biceps and patellae.  Plantars: Toes are downgoing bilaterally. Cerebellar: FNF and HKS are intact bilaterally   I have reviewed the images obtained: MRI brain shows a subcortical lacunar stroke.  Impression: stroke - admit for full stroke workup - this has been ordered. Since pt does not do well with asa will start plavix.  lovenox for dvt prophylaxis. Speech theprapy.  OK to aim for normotension.  Aim for LDL < 70  Recommendations: 1) as above. Stroke attending will assume care after tomorrow

## 2015-12-09 NOTE — ED Notes (Signed)
Admitting MD at bedside.

## 2015-12-09 NOTE — Progress Notes (Signed)
Progress Note   PROGRESS NOTE  Stacy Levy  ZOX:[id number] DOB: July 02, 1932  DOA: 12/08/2015 PCP: Cala BradfordWHITE,CYNTHIA S, MD   Outpatient Specialists:   None.   Brief Narrative:   Stacy Levy is an 82 y.o. female with a PMH of obesity who was admitted 12/08/15 with a chief complaint of speech changes. Upon initial evaluation in the ED, she was noted to have a right-sided facial droop with dysarthria. Neurology was subsequently consulted with MRI showing a subcortical lacunar stroke.  Assessment/Plan:   Principal Problem:   CVA (cerebral infarction)/stroke MRI of the brain shows a subcortical lacunar stroke. Full stroke workup initiated. Plavix started due to aspirin intolerance. Speech therapy, physical therapy and occupational therapy evaluations pending. Follow-up 2-D echo. Preliminary Doppler studies show no significant ICA stenosis. Initiate statin for LDL goal less than 70. Follow-up hemoglobin A1c.  Active Problems:   Hypokalemia We'll supplement potassium.    Obesity (BMI 30-39.9) Dietitian consultation for weight loss education.    Dyslipidemia, goal LDL below 70 Statin changed from Zocor 20 mg daily to Lipitor 40 mg daily, goal LDL 70. Current LDL 98.   Family Communication/Anticipated D/C date and plan/Code Status   DVT prophylaxis: Lovenox ordered. Code Status: Full Code.  Family Communication: Updated at the Bedside. Disposition Plan: Home in 24 hours.   Medical Consultants:    Neurology   Procedures:    Anti-Infectives:   Anti-infectives    None      Subjective:   Stacy Levy since her speech is a little better today. Still slightly slurred. No complaints of paresthesias or loss of power.  Objective:    Filed Vitals:   12/09/15 0127 12/09/15 0218 12/09/15 0430 12/09/15 0630  BP: 150/72 135/57 125/62 123/64  Pulse: 65 67 63   Temp:  98.7 F (37.1 C) 97.8 F (36.6 C) 98.4 F (36.9 C)  TempSrc:  Oral Oral Oral  Resp: 20 20 18 18     Height:  5\' 4"  (1.626 m)    Weight:  101 kg (222 lb 10.6 oz)    SpO2: 98% 95% 96% 95%    Intake/Output Summary (Last 24 hours) at 12/09/15 1232 Last data filed at 12/09/15 0832  Gross per 24 hour  Intake    240 ml  Output      0 ml  Net    240 ml   Filed Weights   12/08/15 1811 12/09/15 0218  Weight: 101.152 kg (223 lb) 101 kg (222 lb 10.6 oz)    Exam: General exam: Appears calm and comfortable.  Respiratory system: Clear to auscultation. Respiratory effort normal. Cardiovascular system: S1 & S2 heard, RRR. No JVD, murmurs, rubs, gallops or clicks. No pedal edema. Gastrointestinal system: Abdomen is nondistended, soft and nontender. No organomegaly or masses felt. Normal bowel sounds heard. Central nervous system: Alert and oriented. No focal neurological deficits. Extremities: Symmetric 5 x 5 power. No clubbing, edema, or cyanosis. Skin: No rashes, lesions or ulcers Psychiatry: Judgement and insight appear normal. Mood & affect appropriate.   Data Reviewed:   I have personally reviewed following labs and imaging studies:  Labs: Basic Metabolic Panel:  Recent Labs Lab 12/08/15 1809 12/09/15 0519  NA 139 139  K 3.7 3.0*  CL 102 101  CO2 28 31  GLUCOSE 102* 108*  BUN 16 14  CREATININE 0.68 0.77  CALCIUM 9.6 9.1   GFR Estimated Creatinine Clearance: 63.7 mL/min (by C-G formula based on Cr of 0.77). Liver Function  Tests:  Recent Labs Lab 12/08/15 1809  AST 21  ALT 17  ALKPHOS 58  BILITOT 0.7  PROT 7.4  ALBUMIN 4.1   Coagulation profile  Recent Labs Lab 12/08/15 1809  INR 1.08    CBC:  Recent Labs Lab 12/08/15 1809 12/09/15 0519  WBC 6.3 6.3  NEUTROABS 3.7 3.3  HGB 11.8* 10.3*  HCT 37.3 34.3*  MCV 61.6* 60.9*  PLT 247 235   Hgb A1c: No results for input(s): HGBA1C in the last 72 hours. Lipid Profile:  Recent Labs  12/08/15 1809  CHOL 179  HDL 55  LDLCALC 97  TRIG 135  CHOLHDL 3.3   Microbiology No results found for this or  any previous visit (from the past 240 hour(s)).  Radiology: Dg Chest 2 View  12/09/2015  CLINICAL DATA:  Stroke yesterday. EXAM: CHEST  2 VIEW COMPARISON:  Chest CT 05/01/2014.  Chest x-ray 04/17/2013. FINDINGS: Low volume found. The lungs are clear wiithout focal pneumonia, edema, pneumothorax or pleural effusion. There is pulmonary vascular congestion without overt pulmonary edema. Cardiopericardial silhouette is at upper limits of normal for size. The visualized bony structures of the thorax are intact. Telemetry leads overlie the chest. IMPRESSION: Low volume film with vascular congestion. Electronically Signed   By: Kennith Center M.D.   On: 12/09/2015 07:47   Mr Brain Wo Contrast  12/08/2015  CLINICAL DATA:  Slurred speech beginning yesterday at 11 a.m. History of obesity. EXAM: MRI HEAD WITHOUT CONTRAST TECHNIQUE: Multiplanar, multiecho pulse sequences of the brain and surrounding structures were obtained without intravenous contrast. COMPARISON:  None. FINDINGS: INTRACRANIAL CONTENTS: 13 x 6 mm focus of reduced diffusion LEFT posterior frontal lobe subcortical white matter with low ADC value, no significant FLAIR abnormality. Punctate focus of susceptibility artifact in LEFT frontal precentral gyrus is nonspecific. The ventricles and sulci are normal for patient's age. Multiple small cerebellar infarcts. Patchy supratentorial white matter FLAIR T2 hyperintensities. No suspicious parenchymal signal, mass lesions, mass effect. No abnormal extra-axial fluid collections. No extra-axial masses though, contrast enhanced sequences would be more sensitive. Major intracranial vascular flow voids present at skull base, dolichoectasia most compatible with chronic hypertension. ORBITS: The included ocular globes and orbital contents are non-suspicious. Status post bilateral ocular lens implants. Probable colobomas bilaterally. SINUSES: The mastoid air-cells and included paranasal sinuses are well-aerated.  SKULL/SOFT TISSUES: No abnormal sellar expansion. No suspicious calvarial bone marrow signal. Craniocervical junction maintained. Patient is edentulous. IMPRESSION: Acute small LEFT frontal lobe infarct. Moderate white matter changes compatible with chronic small vessel ischemic disease. Multiple old small cerebellar infarcts. Electronically Signed   By: Awilda Metro M.D.   On: 12/08/2015 22:44    Medications:   . atorvastatin  40 mg Oral q1800  . clopidogrel  75 mg Oral Daily  . enoxaparin (LOVENOX) injection  40 mg Subcutaneous Q24H  . potassium chloride  20 mEq Oral TID   Continuous Infusions:   Time spent: 25 minutes.   LOS: 0 days   RAMA,CHRISTINA  [facility] Pager [phone]. If unable to reach me by pager, please call my cell phone at [phone].  *Please refer to [email or web address], password [email or web address] to get updated schedule on who will round on this patient, as hospitalists switch teams weekly. If 7PM-7AM, please contact night-coverage at [email or web address], password [email or web address] for any overnight needs.  12/09/2015, 12:32 PM

## 2015-12-09 NOTE — Progress Notes (Signed)
STROKE TEAM PROGRESS NOTE   HISTORY OF PRESENT ILLNESS Stacy Levy is a 82 y.o. female with hx of obesity and not on asa at baseline because of stomach upset. She was speaking with her daughter 2 days ago on the phone and suddenly developed slurred speech but decided to wait. Her speech did not improve by yesterday and she made appt with her PMD who sent her to the ED when he saw her in the office. She denies arm or leg weakness, changes in gait, difficulty swallowing. Denies diplopia, arm and hand clumsiness. She was last known well 12/05/2015. Patient was not administered IV t-PA secondary to delay in arrival. She was admitted for further evaluation and treatment.   SUBJECTIVE (INTERVAL HISTORY) No family is at the bedside.  Overall she feels her condition is stable. "I want to know why i had a stroke." Dr. Pearlean Brownie spoke at length with her about her stroke, etiology and her risk factors.    OBJECTIVE Temp:  [97.8 F (36.6 C)-98.7 F (37.1 C)] 98.4 F (36.9 C) (04/19 0630) Pulse Rate:  [63-73] 63 (04/19 0430) Cardiac Rhythm:  [-] Heart block (04/19 0700) Resp:  [18-20] 18 (04/19 0630) BP: (123-158)/(57-90) 123/64 mmHg (04/19 0630) SpO2:  [95 %-98 %] 95 % (04/19 0630) Weight:  [101 kg (222 lb 10.6 oz)-101.152 kg (223 lb)] 101 kg (222 lb 10.6 oz) (04/19 0218)  CBC:   Recent Labs Lab 12/08/15 1809 12/09/15 0519  WBC 6.3 6.3  NEUTROABS 3.7 3.3  HGB 11.8* 10.3*  HCT 37.3 34.3*  MCV 61.6* 60.9*  PLT 247 235    Basic Metabolic Panel:   Recent Labs Lab 12/08/15 1809 12/09/15 0519  NA 139 139  K 3.7 3.0*  CL 102 101  CO2 28 31  GLUCOSE 102* 108*  BUN 16 14  CREATININE 0.68 0.77  CALCIUM 9.6 9.1    Lipid Panel:     Component Value Date/Time   CHOL 179 12/08/2015 1809   TRIG 135 12/08/2015 1809   HDL 55 12/08/2015 1809   CHOLHDL 3.3 12/08/2015 1809   VLDL 27 12/08/2015 1809   LDLCALC 97 12/08/2015 1809   HgbA1c: No results found for: HGBA1C Urine Drug Screen:  No results found for: LABOPIA, COCAINSCRNUR, LABBENZ, AMPHETMU, THCU, LABBARB    IMAGING  Dg Chest 2 View 12/09/2015  Low volume film with vascular congestion.   Mr Brain Wo Contrast 12/08/2015  Acute small LEFT frontal lobe infarct. Moderate white matter changes compatible with chronic small vessel ischemic disease. Multiple old small cerebellar infarcts.   Carotid Doppler   There is 1-39% bilateral ICA stenosis. Vertebral artery flow is antegrade.     PHYSICAL EXAM Pleasant elderly lady not in distress. . Afebrile. Head is nontraumatic. Neck is supple without bruit.    Cardiac exam no murmur or gallop. Lungs are clear to auscultation. Distal pulses are well felt. Neurological Exam ;  Awake alert oriented x 3 normal speech and language. Mild left lower face asymmetry. Tongue midline. No drift. Mild diminished fine finger movements on right. Orbits left over right upper extremity. Mild right grip weakness.. Normal sensation . Normal coordination. ASSESSMENT/PLAN Stacy Levy is a 82 y.o. female with history of obesity presenting with dysarthria and R facial droop . She did not receive IV t-PA due to delay in arrival.   Stroke:  Small left frontal lobe infarct secondary to small vessel disease source  Resultant  R facial droop and dysarthria  MRI  Small  L frontal lobe infarct  TCD pending   Carotid Doppler  No significant stenosis   2D Echo  pending   LDL 97  HgbA1c pending  Lovenox 40 mg sq daily for VTE prophylaxis Diet Heart Room service appropriate?: Yes; Fluid consistency:: Thin  No antithrombotic prior to admission, now on clopidogrel 75 mg daily  Patient counseled to be compliant with her antithrombotic medications  Ongoing aggressive stroke risk factor management  Therapy recommendations:  pending    Disposition:  pending  (lives alone)  [facility] months. Order written.  Hyperlipidemia  Home meds:  ZOCOR 20  LDL  97, goal < 70  Changed to lipitor 40 mg daily  Continue statin at discharge  Other Stroke Risk Factors  Advanced age  Former smoker, quit in 1975  Obesity, Body mass index is 38.2 kg/(m^2).   Hospital day # 0  Rhoderick MoodyBIBY,SHARON  [facility] See Amion for Pager information 12/09/2015 12:21 PM  I have personally examined this patient, reviewed notes, independently viewed imaging studies, participated in medical decision making and plan of care. I have made any additions or clarifications directly to the above note. Agree with note above.  He presented with slurred speech due to left brain subcortical infarct likely due to small vessel disease. He remains at risk for neurological worsening, recurrent stroke, TIA needs ongoing stroke evaluation and aggressive risk factor modification.I had a long discussion with the patient  at the bedside and answered questions.Agree with clopidogrel for stroke prevention and strict control of lipids. Delia HeadyPramod Keyli Duross, MD Medical Director [facility] Pager: [phone] 12/09/2015 2:53 PM    To contact Stroke Continuity provider, please refer to [email or web address]. After hours, contact General Neurology

## 2015-12-09 NOTE — Care Management Note (Signed)
Case Management Note  Patient Details  Name: Stacy Levy MRN: [id number] Date of Birth: Oct 12, 1931  Subjective/Objective:                    Action/Plan: Patient was admitted with CVA. Lives at home alone. Will follow for discharge needs pending PT/OT evals and physician orders.  Expected Discharge Date:  12/11/15               Expected Discharge Plan:     In-House Referral:     Discharge planning Services     Post Acute Care Choice:    Choice offered to:     DME Arranged:    DME Agency:     HH Arranged:    HH Agency:     Status of Service:  In process, will continue to follow  Medicare Important Message Given:    Date Medicare IM Given:    Medicare IM give by:    Date Additional Medicare IM Given:    Additional Medicare Important Message give by:     If discussed at Long Length of Stay Meetings, dates discussed:    Additional Comments:  Anda KraftRobarge, Kippy Melena C, RN 12/09/2015, 10:55 AM [phone]

## 2015-12-09 NOTE — Evaluation (Signed)
Physical Therapy Evaluation Patient Details Name: Stacy Levy MRN: [id number] DOB: May 07, 1932 Today's Date: 12/09/2015   History of Present Illness  Pt is a 82 y/o F who presented w/ speech difficulty.  Imaging revealed small Lt frontal lob infarct.  Pt's PMH includes obesity, foot surgery.    Clinical Impression  Pt admitted with above diagnosis. Pt currently with functional limitations due to the deficits listed below (see PT Problem List). [name] presents w/ min instability ambulating and w/ high level balance activities which she reports is her baseline.  Her son is planning to stay w/ her at d/c. Pt will benefit from skilled PT to increase their independence and safety with mobility to allow discharge to the venue listed below.      Follow Up Recommendations Outpatient PT;Supervision for mobility/OOB    Equipment Recommendations  None recommended by PT    Recommendations for Other Services OT consult     Precautions / Restrictions Precautions Precautions: Fall Restrictions Weight Bearing Restrictions: No      Mobility  Bed Mobility Overal bed mobility: Modified Independent             General bed mobility comments: Increased time.  HOB elevated.  Transfers Overall transfer level: Needs assistance Equipment used: None Transfers: Sit to/from Stand Sit to Stand: Min guard         General transfer comment: No cues or physical assist needed.  Min instability noted.   Ambulation/Gait Ambulation/Gait assistance: Min guard Ambulation Distance (Feet): 200 Feet Assistive device: None Gait Pattern/deviations: Step-through pattern;Decreased stride length;Drifts right/left   Gait velocity interpretation: at or above normal speed for age/gender General Gait Details: Min instability w/ increased lateral sway Bil on level surface and w/ high level balance activities as documented below. (Pt reports she feels at her baseline w/ ambulation)  Stairs             Wheelchair Mobility    Modified Rankin (Stroke Patients Only) Modified Rankin (Stroke Patients Only) Pre-Morbid Rankin Score: No symptoms Modified Rankin: Moderately severe disability     Balance Overall balance assessment: Needs assistance (denies h/o falls) Sitting-balance support: No upper extremity supported;Feet supported Sitting balance-Leahy Scale: Good     Standing balance support: No upper extremity supported;During functional activity Standing balance-Leahy Scale: Fair               High level balance activites: Backward walking;Turns;Sudden stops;Head turns;Other (comment);Direction changes (stepping over object) High Level Balance Comments: Min instability w/ all high level balance activities Standardized Balance Assessment Standardized Balance Assessment : Dynamic Gait Index   Dynamic Gait Index Level Surface: Mild Impairment Gait with Horizontal Head Turns: Mild Impairment Gait with Vertical Head Turns: Mild Impairment Gait and Pivot Turn: Mild Impairment Step Over Obstacle: Mild Impairment       Pertinent Vitals/Pain Pain Assessment: No/denies pain    Home Living Family/patient expects to be discharged to:: Private residence Living Arrangements: Alone Available Help at Discharge: Family;Available 24 hours/day (son is planning to stay w/ pt at d/c) Type of Home: House Home Access: Stairs to enter Entrance Stairs-Rails: None Entrance Stairs-Number of Steps: 1 Home Layout: One level Home Equipment: None      Prior Function Level of Independence: Independent               Hand Dominance   Dominant Hand: Right    Extremity/Trunk Assessment   Upper Extremity Assessment: Overall WFL for tasks assessed (not formally assessed)  Lower Extremity Assessment: Overall WFL for tasks assessed         Communication   Communication:  (slurred speech, facial droop)  Cognition Arousal/Alertness: Awake/alert Behavior During  Therapy: WFL for tasks assessed/performed Overall Cognitive Status: Within Functional Limits for tasks assessed                      General Comments General comments (skin integrity, edema, etc.): Reviewed signs and symptoms of a stroke and what to do if any are noted    Exercises        Assessment/Plan    PT Assessment Patient needs continued PT services  PT Diagnosis Difficulty walking   PT Problem List Decreased balance;Decreased safety awareness  PT Treatment Interventions DME instruction;Gait training;Functional mobility training;Therapeutic activities;Stair training;Therapeutic exercise;Balance training;Neuromuscular re-education;Patient/family education   PT Goals (Current goals can be found in the Care Plan section) Acute Rehab PT Goals Patient Stated Goal: to go home PT Goal Formulation: With patient Time For Goal Achievement: 12/23/15 Potential to Achieve Goals: Good    Frequency Min 3X/week   Barriers to discharge Inaccessible home environment 1 step to enter home    Co-evaluation               End of Session Equipment Utilized During Treatment: Gait belt Activity Tolerance: Patient tolerated treatment well Patient left: in chair;with call bell/phone within reach;with chair alarm set Nurse Communication: Mobility status         Time: 1610-9604 PT Time Calculation (min) (ACUTE ONLY): 19 min   Charges:   PT Evaluation $PT Eval Low Complexity: 1 Procedure     PT G Codes:       Encarnacion Chu PT, DPT  Pager: [phone] Phone: [phone] 12/09/2015, 2:45 PM

## 2015-12-09 NOTE — Progress Notes (Signed)
Nutrition Education Note  RD consulted for nutrition education regarding weight loss.   82 y.o. female with a PMH of obesity who was admitted 12/08/15 with a chief complaint of speech changes; noted to have a right-sided facial droop with dysarthria.  MRI showing a subcortical lacunar stroke.  Lipid Panel     Component Value Date/Time   CHOL 179 12/08/2015 1809   TRIG 135 12/08/2015 1809   HDL 55 12/08/2015 1809   CHOLHDL 3.3 12/08/2015 1809   VLDL 27 12/08/2015 1809   LDLCALC 97 12/08/2015 1809    Pt asleep at time of visit. RD awoke patient and pt agreeable to RD visit. Pt reports understanding heart healthy diet, stating is is high in fruits and vegetables. RD began to discuss the it was also low in salt and fat and emphasized whole grains and lean protein such as chicken and fish. Pt began to fall back asleep. Pt unable to participate in education at this time.   RD provided "Stroke Nutrition Therapy" and "20 Ways to Eat More Fruits and Vegetables" handout from the [employer] of [employer]. RD name and contact information provided. Will re-attempt education at return visit.   Body mass index is 38.2 kg/(m^2). Pt meets criteria for Obesity based on current BMI. Current diet order is Heart Healthy, patient is consuming approximately 50-100% of meals at this time. Pt reports normal appetite and eating well. Labs and medications reviewed.   Dorothea Ogleeanne Willard Farquharson RD, LDN Inpatient Clinical Dietitian Pager: [phone] After Hours Pager: [phone]

## 2015-12-09 NOTE — Progress Notes (Signed)
TCD completed.  Farrel DemarkJill Eunice, RDMS, RVT 12/09/2015

## 2015-12-09 NOTE — Progress Notes (Signed)
*  PRELIMINARY RESULTS* Vascular Ultrasound Carotid Duplex (Doppler) has been completed.  Preliminary findings: Bilateral: No significant (1-39%) ICA stenosis. Antegrade vertebral flow.    Farrel DemarkJill Eunice, RDMS, RVT  12/09/2015, 10:19 AM

## 2015-12-09 NOTE — H&P (Signed)
[facility] History and Physical  [name] WJX:[id number] DOB: July 10, 1932 DOA: 12/08/2015  Referring physician:  PCP: Cala Bradford, MD   Chief Complaint: "Everyone said my talking wasn't right."  HPI: Stacy Levy is a 82 y.o. female with past medical history of obesity presents to ER with speech difficulty. Patient states that  2 days before coming to the ER she did noticed her speech changed. She originally thought it was a cold and will pass but over time she ran into people who were familiar with her and said that it's more than a cold. Patient was seen by primary medical doctor who stated the patient come to ER for further evaluation. Patient came into the ED and was found have a right-sided facial droop with some dysarthria. Neurology was consulted in the emergency room patient was admitted for stroke.  Patient denies any headache, blurry vision, double vision, trouble hearing, trouble speaking, chest pain, shortness of breath, nausea, vomiting, no abdominal pain, cough, wheeze, dysuria, weakness.  CODE STATUS full   Review of Systems:  Pertinent review of systems history of present illness. All other systems were reviewed and are negative.  Past Medical History  Diagnosis Date  . Obesity    Past Surgical History  Procedure Laterality Date  . Appendectomy    . Abdominal hysterectomy    . Foot surgery     Social History:  reports that she has never smoked. She does not have any smokeless tobacco history on file. She reports that she does not drink alcohol. Her drug history is not on file.  Allergies  Allergen Reactions  . Aspirin Other (See Comments)    Upset stomach     No family history on file.   Prior to Admission medications   Medication Sig Start Date End Date Taking? Authorizing Provider  guaiFENesin (ROBITUSSIN) 100 MG/5ML liquid Take 200 mg by mouth 3 (three) times daily as needed for cough or congestion.   Yes Historical Provider, MD  Multiple  Vitamin (THERA) TABS Take 1 tablet by mouth daily.   Yes Historical Provider, MD  simvastatin (ZOCOR) 20 MG tablet Take 20 mg by mouth daily. 11/12/15  Yes Historical Provider, MD  valsartan-hydrochlorothiazide (DIOVAN-HCT) 320-25 MG tablet Take 1 tablet by mouth daily. 11/12/15  Yes Historical Provider, MD   Physical Exam: Filed Vitals:   12/08/15 1811 12/08/15 2322 12/09/15 0127 12/09/15 0218  BP: 158/90  150/72 135/57  Pulse: 73  65 67  Temp: 98.4 F (36.9 C) 98 F (36.7 C)  98.7 F (37.1 C)  TempSrc: Oral   Oral  Resp: Height:  (1.626 m)    (1.626 m)  Weight: 101.152 kg (223 lb)     SpO2: 97%  98%     Wt Readings from Last 3 Encounters:  12/08/15 101.152 kg (223 lb)    General:  Appears calm and comfortable Eyes:  PERRL, EOMI, normal lids, iris ENT:  grossly normal hearing, lips & tongue Neck: no LAD, masses or thyromegaly Cardiovascular:  RRR, no m/r/g. No LE edema.  Respiratory:  CTA bilaterally, no w/r/r. Normal respiratory effort. Abdomen:  soft, ntnd Skin:  no rash or induration seen on limited exam Musculoskeletal:  grossly normal tone BUE/BLE Psychiatric:  grossly normal mood and affect, speech fluent and appropriate Neurologic:  CN 2-6,8-12 grossly intact, moves all extremities in coordinated fashion. Dysarthria present. Right-sided facial droop.           Labs on  Admission:  Basic Metabolic Panel:  Recent Labs Lab 12/08/15 1809  NA 139  K 3.7  CL 102  CO2 28  GLUCOSE 102*  BUN 16  CREATININE 0.68  CALCIUM 9.6   Liver Function Tests:  Recent Labs Lab 12/08/15 1809  AST 21  ALT 17  ALKPHOS 58  BILITOT 0.7  PROT 7.4  ALBUMIN 4.1   No results for input(s): LIPASE, AMYLASE in the last 168 hours. No results for input(s): AMMONIA in the last 168 hours. CBC:  Recent Labs Lab 12/08/15 1809  WBC 6.3  NEUTROABS 3.7  HGB 11.8*  HCT 37.3  MCV 61.6*  PLT 247   Cardiac Enzymes: No results for input(s): CKTOTAL, CKMB,  CKMBINDEX, TROPONINI in the last 168 hours.  BNP (last 3 results) No results for input(s): BNP in the last 8760 hours.  ProBNP (last 3 results) No results for input(s): PROBNP in the last 8760 hours.   CREATININE: 0.68 (12/08/15 1809) Estimated creatinine clearance - 63.8 mL/min  CBG: No results for input(s): GLUCAP in the last 168 hours.  Radiological Exams on Admission: Mr Brain Wo Contrast  12/08/2015  CLINICAL DATA:  Slurred speech beginning yesterday at 11 a.m. History of obesity. EXAM: MRI HEAD WITHOUT CONTRAST TECHNIQUE: Multiplanar, multiecho pulse sequences of the brain and surrounding structures were obtained without intravenous contrast. COMPARISON:  None. FINDINGS: INTRACRANIAL CONTENTS: 13 x 6 mm focus of reduced diffusion LEFT posterior frontal lobe subcortical white matter with low ADC value, no significant FLAIR abnormality. Punctate focus of susceptibility artifact in LEFT frontal precentral gyrus is nonspecific. The ventricles and sulci are normal for patient's age. Multiple small cerebellar infarcts. Patchy supratentorial white matter FLAIR T2 hyperintensities. No suspicious parenchymal signal, mass lesions, mass effect. No abnormal extra-axial fluid collections. No extra-axial masses though, contrast enhanced sequences would be more sensitive. Major intracranial vascular flow voids present at skull base, dolichoectasia most compatible with chronic hypertension. ORBITS: The included ocular globes and orbital contents are non-suspicious. Status post bilateral ocular lens implants. Probable colobomas bilaterally. SINUSES: The mastoid air-cells and included paranasal sinuses are well-aerated. SKULL/SOFT TISSUES: No abnormal sellar expansion. No suspicious calvarial bone marrow signal. Craniocervical junction maintained. Patient is edentulous. IMPRESSION: Acute small LEFT frontal lobe infarct. Moderate white matter changes compatible with chronic small vessel ischemic disease.  Multiple old small cerebellar infarcts. Electronically Signed   By: Awilda Metroourtnay  Bloomer M.D.   On: 12/08/2015 22:44    EKG: Independently reviewed. Ventricular rate 75. Full 188 QRS 86 QTC 39 normal sinus rhythm no ST elevation or depression  Assessment/Plan Active Problems:   CVA (cerebral infarction)   Stroke (cerebrum) (HCC)  Stroke -Left frontal lobe infarct seen on CT Carotid ultrasound, echo heart ordered Patient started on Plavix as she states she is unable to tolerate aspirin Permissive hypertension Am labs Consults PT, OT, speech Neurology consult in the ED and following patient Start statin  Code Status: full  DVT Prophylaxis: SCDs Family Communication: none in room Disposition Plan: Pending Improvement    Haydee SalterPhillip M Hobbs, MD Family Medicine [facility] [email or web address] Password [email or web address]

## 2015-12-10 ENCOUNTER — Inpatient Hospital Stay (HOSPITAL_COMMUNITY): Payer: Commercial Managed Care - HMO

## 2015-12-10 DIAGNOSIS — I6350 Cerebral infarction due to unspecified occlusion or stenosis of unspecified cerebral artery: Secondary | ICD-10-CM

## 2015-12-10 DIAGNOSIS — R7303 Prediabetes: Secondary | ICD-10-CM | POA: Diagnosis present

## 2015-12-10 DIAGNOSIS — I639 Cerebral infarction, unspecified: Principal | ICD-10-CM

## 2015-12-10 LAB — ECHOCARDIOGRAM COMPLETE
HEIGHTINCHES: 64 in
WEIGHTICAEL: 3562.63 [oz_av]

## 2015-12-10 LAB — HEMOGLOBIN A1C
Hgb A1c MFr Bld: 6.3 % — ABNORMAL HIGH (ref 4.8–5.6)
Mean Plasma Glucose: 134 mg/dL

## 2015-12-10 MED ORDER — ATORVASTATIN CALCIUM 40 MG PO TABS: 40.0000 mg | ORAL_TABLET | Freq: Every day | ORAL | Status: AC

## 2015-12-10 MED ORDER — CLOPIDOGREL BISULFATE 75 MG PO TABS: 75.0000 mg | ORAL_TABLET | Freq: Every day | ORAL | Status: AC

## 2015-12-10 NOTE — Progress Notes (Signed)
SLP Cancellation Note  Patient Details Name: Stacy Levy MRN: [id number] DOB: 11-01-1931   Cancelled treatment:       Reason Eval/Treat Not Completed: Patient at procedure or test/unavailable. Will reattempt later today as time allows. Spoke with RN who has concerns with swallow function, so plan will be to complete bedside swallow and speech/ language eval.  Metro KungOleksiak, Amy K, MA, CCC-SLP 12/10/2015, 10:01 AM [phone]

## 2015-12-10 NOTE — Progress Notes (Signed)
Pt discharged home with family. Telemetry and IV discontinued. Case Management set up outpatient physical therapy. Discharge instructions given. Pt questions asked and answered. Pt waiting for transportation from family. Lawson RadarHeather M Delbert Vu

## 2015-12-10 NOTE — Evaluation (Signed)
Speech Language Pathology Evaluation Patient Details Name: Stacy Levy MRN: [id number] DOB: [birth year] Today's Date: 12/10/2015 Time: [id number] SLP Time Calculation (min) (ACUTE ONLY): 23 min  Problem List:  Patient Active Problem List   Diagnosis Date Noted  . Prediabetes 12/10/2015  . CVA (cerebral infarction) 12/09/2015  . Stroke (cerebrum) (HCC) 12/09/2015  . Obesity (BMI 30-39.9) 12/09/2015  . Dyslipidemia, goal LDL below 70 12/09/2015  . Hypokalemia 12/09/2015  . Cerebral infarction due to unspecified mechanism    Past Medical History:  Past Medical History  Diagnosis Date  . Obesity    Past Surgical History:  Past Surgical History  Procedure Laterality Date  . Appendectomy    . Abdominal hysterectomy    . Foot surgery     HPI:  Pt is an 82 y.o. female with PMH of obesity, presented to ER with speech difficulty on 4/18; difficulty had been ongoing for 2 days prior to admission. Pt noted to have R side droop and dysarthria. MRI 4/18 showed acute small L frontal lobe infarct. Pt passed RN stroke swallow screen. Speech language eval ordered as part of stroke workup.   Assessment / Plan / Recommendation Clinical Impression  Pt currently with language, motor speech, and cognitive skills within functional limits for tasks assessed. Pt reported having slurred speech initially when admitted but that it seems "back to normal". Pt continues to have a slight droop on R side but this is having minimal impact on speech intelligibility. No f/u recommended at this time. SLP will sign off; please re-consult if needs arise.    SLP Assessment  Patient does not need any further Speech Lanaguage Pathology Services    Follow Up Recommendations  None    Frequency and Duration           SLP Evaluation Prior Functioning  Cognitive/Linguistic Baseline: Within functional limits Type of Home: House   Cognition  Overall Cognitive Status: Within Functional Limits for tasks  assessed Arousal/Alertness: Awake/alert Orientation Level: Oriented X4 Attention: Alternating Alternating Attention: Appears intact Memory: Appears intact Awareness: Appears intact Problem Solving: Appears intact Safety/Judgment: Appears intact    Comprehension  Auditory Comprehension Overall Auditory Comprehension: Appears within functional limits for tasks assessed Yes/No Questions: Within Functional Limits Commands: Within Functional Limits Conversation: Complex    Expression Verbal Expression Overall Verbal Expression: Appears within functional limits for tasks assessed Initiation: No impairment Level of Generative/Spontaneous Verbalization: Conversation Naming: No impairment Pragmatics: No impairment Non-Verbal Means of Communication: Not applicable   Oral / Motor  Oral Motor/Sensory Function Overall Oral Motor/Sensory Function: Mild impairment Facial ROM: Reduced right Facial Symmetry: Abnormal symmetry right Lingual ROM: Within Functional Limits Lingual Symmetry: Within Functional Limits Motor Speech Overall Motor Speech: Appears within functional limits for tasks assessed Respiration: Within functional limits Phonation: Normal Resonance: Within functional limits Articulation: Within functional limitis Intelligibility: Intelligible Motor Planning: Witnin functional limits Motor Speech Errors: Not applicable   GO                    Metro KungOleksiak, Tylek Boney K, MA, CCC-SLP 12/10/2015, 12:13 PM [phone]

## 2015-12-10 NOTE — Discharge Summary (Signed)
Physician Discharge Summary  Stacy Levy ZOX:[id number] DOB: 1931-10-06 DOA: 12/08/2015  PCP: Stacy BradfordWHITE,CYNTHIA S, MD  Admit date: 12/08/2015 Discharge date: 12/10/2015   Recommendations for Outpatient Follow-Up:   1. F/U with neurology arranged in 2 weeks. 2. Outpatient physical therapy arranged.   Discharge Diagnosis:   Principal Problem:    CVA (cerebral infarction) Active Problems:    Stroke (cerebrum) (HCC)    Obesity (BMI 30-39.9)    Dyslipidemia, goal LDL below 70    Hypokalemia    Cerebral infarction due to unspecified mechanism    Prediabetes  Discharge disposition:  Home.    Discharge Condition: Improved.  Diet recommendation: Low sodium, heart healthy.  Carbohydrate-modified.    History of Present Illness:   Stacy Levy is an 82 y.o. female with a PMH of obesity who was admitted 12/08/15 with a chief complaint of speech changes. Upon initial evaluation in the ED, she was noted to have a right-sided facial droop with dysarthria. Neurology was subsequently consulted with MRI showing a subcortical lacunar stroke.  Hospital Course by Problem:    CVA (cerebral infarction)/stroke MRI of the brain shows a subcortical lacunar stroke. Full stroke workup initiated. Plavix started due to aspirin intolerance. Speech therapy, physical therapy and occupational therapy evaluations performed with recommendations for outpatient PT. No speech therapy follow-up recommended. 2-D echo negative for embolic source.Doppler studies showed no significant ICA stenosis. Continue higher dose statin for LDL goal less than 70. Hemoglobin A1c 6.3%. Counseled to decrease sugar/carbohydrate intake.  Active Problems:  Prediabetes Dietitian provided dietary education.   Hypokalemia Potassium supplementation provided.   Obesity (BMI 30-39.9) Dietitian provided weight loss education.   Dyslipidemia, goal LDL below 70 Statin changed from Zocor 20 mg daily to Lipitor 40 mg daily,  goal LDL 70. Current LDL 98.    Medical Consultants:    Neurology   Discharge Exam:   Filed Vitals:   12/10/15 0635 12/10/15 0926  BP: 98/79 144/71  Pulse: 87 75  Temp: 97.8 F (36.6 C) 97.8 F (36.6 C)  Resp: 20 20   Filed Vitals:   12/09/15 2203 12/10/15 0137 12/10/15 0635 12/10/15 0926  BP: 129/52 143/69 98/79 144/71  Pulse: 70 59 87 75  Temp: 98.2 F (36.8 C) 98.2 F (36.8 C) 97.8 F (36.6 C) 97.8 F (36.6 C)  TempSrc: Oral Oral Oral Oral  Resp: 18 18 20 20   Height:      Weight:      SpO2: 96% 95% 95% 97%   General exam: Appears calm and comfortable.  Respiratory system: Clear to auscultation. Respiratory effort normal. Cardiovascular system: S1 & S2 heard, RRR. No JVD, murmurs, rubs, gallops or clicks. No pedal edema. Gastrointestinal system: Abdomen is nondistended, soft and nontender. No organomegaly or masses felt. Normal bowel sounds heard. Central nervous system: Alert and oriented. No focal neurological deficits. Extremities: Symmetric 5 x 5 power. No clubbing, edema, or cyanosis. Skin: No rashes, lesions or ulcers Psychiatry: Judgement and insight appear normal. Mood & affect appropriate.  Neuro: Mild facial droop.  The results of significant diagnostics from this hospitalization (including imaging, microbiology, ancillary and laboratory) are listed below for reference.     Procedures and Diagnostic Studies:   Dg Chest 2 View  12/09/2015  CLINICAL DATA:  Stroke yesterday. EXAM: CHEST  2 VIEW COMPARISON:  Chest CT 05/01/2014.  Chest x-ray 04/17/2013. FINDINGS: Low volume found. The lungs are clear wiithout focal pneumonia, edema, pneumothorax or pleural effusion. There is pulmonary vascular congestion without  overt pulmonary edema. Cardiopericardial silhouette is at upper limits of normal for size. The visualized bony structures of the thorax are intact. Telemetry leads overlie the chest. IMPRESSION: Low volume film with vascular congestion.  Electronically Signed   By: Kennith Center M.D.   On: 12/09/2015 07:47   Mr Brain Wo Contrast  12/08/2015  CLINICAL DATA:  Slurred speech beginning yesterday at 11 a.m. History of obesity. EXAM: MRI HEAD WITHOUT CONTRAST TECHNIQUE: Multiplanar, multiecho pulse sequences of the brain and surrounding structures were obtained without intravenous contrast. COMPARISON:  None. FINDINGS: INTRACRANIAL CONTENTS: 13 x 6 mm focus of reduced diffusion LEFT posterior frontal lobe subcortical white matter with low ADC value, no significant FLAIR abnormality. Punctate focus of susceptibility artifact in LEFT frontal precentral gyrus is nonspecific. The ventricles and sulci are normal for patient'Levy age. Multiple small cerebellar infarcts. Patchy supratentorial white matter FLAIR T2 hyperintensities. No suspicious parenchymal signal, mass lesions, mass effect. No abnormal extra-axial fluid collections. No extra-axial masses though, contrast enhanced sequences would be more sensitive. Major intracranial vascular flow voids present at skull base, dolichoectasia most compatible with chronic hypertension. ORBITS: The included ocular globes and orbital contents are non-suspicious. Status post bilateral ocular lens implants. Probable colobomas bilaterally. SINUSES: The mastoid air-cells and included paranasal sinuses are well-aerated. SKULL/SOFT TISSUES: No abnormal sellar expansion. No suspicious calvarial bone marrow signal. Craniocervical junction maintained. Patient is edentulous. IMPRESSION: Acute small LEFT frontal lobe infarct. Moderate white matter changes compatible with chronic small vessel ischemic disease. Multiple old small cerebellar infarcts. Electronically Signed   By: Awilda Metro M.D.   On: 12/08/2015 22:44     Labs:   Basic Metabolic Panel:  Recent Labs Lab 12/08/15 1809 12/09/15 0519  NA 139 139  K 3.7 3.0*  CL 102 101  CO2 28 31  GLUCOSE 102* 108*  BUN 16 14  CREATININE 0.68 0.77  CALCIUM 9.6  9.1   GFR Estimated Creatinine Clearance: 63.7 mL/min (by C-G formula based on Cr of 0.77). Liver Function Tests:  Recent Labs Lab 12/08/15 1809  AST 21  ALT 17  ALKPHOS 58  BILITOT 0.7  PROT 7.4  ALBUMIN 4.1   Coagulation profile  Recent Labs Lab 12/08/15 1809  INR 1.08    CBC:  Recent Labs Lab 12/08/15 1809 12/09/15 0519  WBC 6.3 6.3  NEUTROABS 3.7 3.3  HGB 11.8* 10.3*  HCT 37.3 34.3*  MCV 61.6* 60.9*  PLT 247 235   Hgb A1c  Recent Labs  12/09/15 0519  HGBA1C 6.3*   Lipid Profile  Recent Labs  12/08/15 1809  CHOL 179  HDL 55  LDLCALC 97  TRIG 135  CHOLHDL 3.3     Discharge Instructions:   Discharge Instructions    Ambulatory referral to Neurology    Complete by:  As directed   This is a routine stroke follow up. An appointment is requested in 1 month with NP     Ambulatory referral to Physical Therapy    Complete by:  As directed   Iontophoresis - 4 mg/ml of dexamethasone:  No  T.E.N.Levy. Unit Evaluation and Dispense as Indicated:  No     Call MD for:  difficulty breathing, headache or visual disturbances    Complete by:  As directed      Call MD for:  persistant dizziness or light-headedness    Complete by:  As directed      Diet - low sodium heart healthy    Complete by:  As directed  Diet Carb Modified    Complete by:  As directed      Increase activity slowly    Complete by:  As directed             Medication List    STOP taking these medications        simvastatin 20 MG tablet  Commonly known as:  ZOCOR      TAKE these medications        atorvastatin 40 MG tablet  Commonly known as:  LIPITOR  Take 1 tablet (40 mg total) by mouth daily at 6 PM.     clopidogrel 75 MG tablet  Commonly known as:  PLAVIX  Take 1 tablet (75 mg total) by mouth daily.     guaiFENesin 100 MG/5ML liquid  Commonly known as:  ROBITUSSIN  Take 200 mg by mouth 3 (three) times daily as needed for cough or congestion.     THERA Tabs    Take 1 tablet by mouth daily.     valsartan-hydrochlorothiazide 320-25 MG tablet  Commonly known as:  DIOVAN-HCT  Take 1 tablet by mouth daily.           Follow-up Information    Follow up with [facility] In 2 months.   Specialty:  Neurology   Why:  for appt with Nurse Practitioner for Dr. Pearlean Brownie, Stroke Clinic, Office will call you with appointment date & time   Contact information:   [address] [place] [address] [phone]       Time coordinating discharge: 35 minutes.  Signed:  RAMA,CHRISTINA  Pager [phone] [facility] 12/10/2015, 2:37 PM

## 2015-12-10 NOTE — Progress Notes (Signed)
RD following-up for nutrition education as pt was too tired yesterday. Pt out of room at time of visit. Will attempt again later today as time allows.   Dorothea Ogleeanne Emmabelle Fear RD, LDN Pager: [phone]

## 2015-12-10 NOTE — Evaluation (Signed)
Clinical/Bedside Swallow Evaluation Patient Details  Name: Stacy Levy MRN: [id number] Date of Birth: Jan 16, 1932  Today's Date: 12/10/2015 Time: SLP Start Time (ACUTE ONLY): 1145 SLP Stop Time (ACUTE ONLY): 1208 SLP Time Calculation (min) (ACUTE ONLY): 23 min  Past Medical History:  Past Medical History  Diagnosis Date  . Obesity    Past Surgical History:  Past Surgical History  Procedure Laterality Date  . Appendectomy    . Abdominal hysterectomy    . Foot surgery     HPI:  Pt is an 82 y.o. female with PMH of obesity, presented to ER with speech difficulty on 4/18; difficulty had been ongoing for 2 days prior to admission. Pt noted to have R side droop and dysarthria. MRI 4/18 showed acute small L frontal lobe infarct. Pt passed RN stroke swallow screen. Speech language eval ordered as part of stroke workup.   Assessment / Plan / Recommendation Clinical Impression  Pt with a prolonged cough x1 following trial of solid while consuming lunch. Pt reports that this tends to happen only at the beginning of meals and that it has been happening for months. Pt did not demonstrate any other overt difficulties at bedside; swallow appeared timely. No difficulties observed with pill. Pt did report globus sensation during coughing spell but reported only noticing it during that instance. Given these findings, aspiration risk appears mild-moderate, risk reduced with use of strategies. Recommend continuing regular diet with thin liquids, meds whole with liquid. Reviewed strategies with pt: small bites/ sips, alternate small bite of food with small sip of liquid, sit upright 30-60 minutes after meal, try to swallow prior to inhaling (do not inhale deeply with food in mouth). Will sign off at this time; please re-consult if needs arise.    Aspiration Risk  Mild aspiration risk;Moderate aspiration risk    Diet Recommendation Thin liquid;Regular   Liquid Administration via: Cup;Straw Medication  Administration: Whole meds with liquid Supervision: Patient able to self feed;Intermittent supervision to cue for compensatory strategies Compensations: Slow rate;Small sips/bites;Follow solids with liquid Postural Changes: Seated upright at 90 degrees;Remain upright for at least 30 minutes after po intake    Other  Recommendations Recommended Consults: Other (Comment) (informed pt to speak with MD if condition worsens) Oral Care Recommendations: Oral care BID   Follow up Recommendations  None    Frequency and Duration            Prognosis        Swallow Study   General HPI: Pt is an 82 y.o. female with PMH of obesity, presented to ER with speech difficulty on 4/18; difficulty had been ongoing for 2 days prior to admission. Pt noted to have R side droop and dysarthria. MRI 4/18 showed acute small L frontal lobe infarct. Pt passed RN stroke swallow screen. Speech language eval ordered as part of stroke workup. Type of Study: Bedside Swallow Evaluation Diet Prior to this Study: Regular;Thin liquids Temperature Spikes Noted: No Respiratory Status: Room air History of Recent Intubation: No Behavior/Cognition: Alert;Cooperative;Pleasant mood Oral Cavity Assessment: Within Functional Limits Oral Care Completed by SLP: No Vision: Functional for self-feeding Self-Feeding Abilities: Able to feed self Patient Positioning: Upright in bed Baseline Vocal Quality: Normal Volitional Cough: Strong Volitional Swallow: Able to elicit    Oral/Motor/Sensory Function Overall Oral Motor/Sensory Function: Mild impairment Facial ROM: Reduced right Facial Symmetry: Abnormal symmetry right Lingual ROM: Within Functional Limits Lingual Symmetry: Within Functional Limits   Ice Chips Ice chips: Not tested   Thin  Liquid Thin Liquid: Within functional limits Presentation: Straw    Nectar Thick Nectar Thick Liquid: Not tested   Honey Thick Honey Thick Liquid: Not tested   Puree Puree: Within  functional limits Presentation: Self Fed;Spoon   Solid   GO   Solid: Impaired Presentation: Self Fed Pharyngeal Phase Impairments: Cough - Immediate        Metro KungOleksiak, Amy K, MA, CCC-SLP 12/10/2015,12:22 PM [phone]

## 2015-12-10 NOTE — Progress Notes (Signed)
Spoke with Tiffany in echo. States pt is on their schedule. Requested am test for discharge planning. Lawson RadarHeather M Sendy Pluta

## 2015-12-10 NOTE — Progress Notes (Signed)
  Echocardiogram 2D Echocardiogram has been performed.  Halona Amstutz 12/10/2015, 11:02 AM

## 2015-12-10 NOTE — Evaluation (Signed)
Occupational Therapy Evaluation Patient Details Name: Stacy Levy [place] MRN: [id number] DOB: 1932/07/05 Today's Date: 12/10/2015    History of Present Illness Pt is a 82 y/o F who presented w/ speech difficulty.  Imaging revealed small Lt frontal lob infarct.  Pt's PMH includes obesity, foot surgery.   Clinical Impression   Pt reports she was independent with ADLs and mobility PTA. Currently pt overall min guard for safety with ADLs and functional mobility. All education completed; pt with no further questions or concerns for OT at this time. Pt planning to d/c home with 24/7 supervision from her son initially. No further acute OT needs identified; signing off at this time. Please re-consult if needs change. Thank you for this referral.    Follow Up Recommendations  No OT follow up;Supervision - Intermittent    Equipment Recommendations  None recommended by OT    Recommendations for Other Services       Precautions / Restrictions Precautions Precautions: Fall Restrictions Weight Bearing Restrictions: No      Mobility Bed Mobility Overal bed mobility: Modified Independent                Transfers Overall transfer level: Needs assistance Equipment used: None Transfers: Sit to/from Stand Sit to Stand: Min guard         General transfer comment: Min guard for safety; no physical assist required. Increased time needed.    Balance Overall balance assessment: Needs assistance Sitting-balance support: Feet supported;No upper extremity supported Sitting balance-Leahy Scale: Good     Standing balance support: No upper extremity supported;During functional activity Standing balance-Leahy Scale: Fair                              ADL Overall ADL's : Needs assistance/impaired Eating/Feeding: Set up;Sitting   Grooming: Min guard;Standing   Upper Body Bathing: Supervision/ safety;Sitting;Set up   Lower Body Bathing: Min guard;Sit to/from stand   Upper  Body Dressing : Supervision/safety;Set up;Sitting   Lower Body Dressing: Min guard;Sit to/from stand   Toilet Transfer: Min guard;Ambulation;Comfort height toilet;Grab bars   Toileting- Clothing Manipulation and Hygiene: Min guard;Sit to/from stand   Tub/ Shower Transfer: Min guard;Ambulation;Shower seat;Walk-in shower   Functional mobility during ADLs: Min guard General ADL Comments: Educated pt on home safety, energy conservation strategies.     Vision Vision Assessment?: No apparent visual deficits   Perception     Praxis      Pertinent Vitals/Pain Pain Assessment: No/denies pain     Hand Dominance Right   Extremity/Trunk Assessment Upper Extremity Assessment Upper Extremity Assessment: Overall WFL for tasks assessed   Lower Extremity Assessment Lower Extremity Assessment: Defer to PT evaluation   Cervical / Trunk Assessment Cervical / Trunk Assessment: Other exceptions Cervical / Trunk Exceptions: obesity   Communication Communication Communication: No difficulties   Cognition Arousal/Alertness: Awake/alert Behavior During Therapy: WFL for tasks assessed/performed Overall Cognitive Status: Within Functional Limits for tasks assessed                     General Comments       Exercises       Shoulder Instructions      Home Living Family/patient expects to be discharged to:: Private residence Living Arrangements: Alone Available Help at Discharge: Family;Available 24 hours/day (son planning to stay with pt for 7 days) Type of Home: House Home Access: Stairs to enter [employer] of Steps: 1 Entrance Stairs-Rails: None  Home Layout: One level     Bathroom Shower/Tub: Producer, television/film/videoWalk-in shower   Bathroom Toilet: Handicapped height     Home Equipment: None          Prior Functioning/Environment Level of Independence: Independent             OT Diagnosis: Generalized weakness   OT Problem List:     OT Treatment/Interventions:       OT Goals(Current goals can be found in the care plan section) Acute Rehab OT Goals Patient Stated Goal: to go home OT Goal Formulation: All assessment and education complete, DC therapy  OT Frequency:     Barriers to D/C:            Co-evaluation              End of Session    Activity Tolerance: Patient tolerated treatment well Patient left: in chair;with call bell/phone within reach;with nursing/sitter in room   Time: [id number] OT Time Calculation (min): 24 min Charges:  OT General Charges $OT Visit: 1 Procedure OT Evaluation $OT Eval Low Complexity: 1 Procedure OT Treatments $Self Care/Home Management : 8-22 mins G-Codes:     Gaye AlkenBailey A Lekisha Mcghee M.S., OTR/L Pager: [phone]  12/10/2015, 2:38 PM

## 2015-12-10 NOTE — Progress Notes (Signed)
Nutrition Education Note  RD consulted for nutrition education.   RD provided "Stroke Nutrition Therapy" and "20 Ways to Eat More Fruits and Vegetables" handouts from the Academy of Nutrition and Dietetics. Reviewed patient's dietary recall. Provided examples on ways to decrease sodium and fat intake in diet. Discouraged intake of processed foods. Encouraged fresh fruits and vegetables as well as whole grain sources of carbohydrates to maximize fiber intake. Teach back method used.   Expect good compliance. Pt voices that she can cut back on her intake of bacon, switch to lower fat milk, and user lower sodium flavoring methods when cooking. She also mentioned fruits and vegetables that she plans to eat more often.   RD contact information provided. Pt about to be discharged at time of visit.   Stacy Levy RD, LDN Inpatient Clinical Dietitian Pager: [phone] After Hours Pager: [phone]

## 2015-12-10 NOTE — Care Management Note (Signed)
Case Management Note  Patient Details  Name: Stacy Levy MRN: [id number] Date of Birth: [birth year]  Subjective/Objective:                    Action/Plan: Patient discharging home with self care. Orders for outpatient PT. CM met with the patient and she would like to go to the [facility]. CM placed order in EPIC and information on the AVS. Bedside RN updated.   Expected Discharge Date:  12/11/15               Expected Discharge Plan:  Home/Self Care  In-House Referral:     Discharge planning Services  CM Consult  Post Acute Care Choice:    Choice offered to:     DME Arranged:    DME Agency:     HH Arranged:    HH Agency:     Status of Service:  Completed, signed off  Medicare Important Message Given:    Date Medicare IM Given:    Medicare IM give by:    Date Additional Medicare IM Given:    Additional Medicare Important Message give by:     If discussed at [place] of Stay Meetings, dates discussed:    Additional Comments:  Pollie Friar, RN 12/10/2015, 3:17 PM

## 2015-12-10 NOTE — Discharge Instructions (Signed)
Ischemic Stroke Treated Without Warfarin °An ischemic stroke is the sudden death of brain tissue. Blood carries oxygen to all areas of your body. This type of stroke happens when your blood does not flow to your brain like normal. Your brain cannot get the oxygen it needs. This is an emergency. °Symptoms of any stroke usually happen all of the sudden. You may notice them when you wake up. They can include sudden: °· Weakness or loss of feeling in your face, arm, or leg, especially on one side of your body. °· Confusion. °· Trouble talking or understanding. °· Trouble seeing. °· Trouble walking or moving your arms or legs. °· Dizziness. °· Loss of balance or coordination. °· Severe headache with no known cause. °Get help as soon as any of these problems start. This is important so your doctor can treat you right away with:  °· Medicines that dissolve blood clots. °· A device to remove a blood clot. °A few hours after the start of your symptoms, your doctor may treat you with: °· Oxygen. °· Medicines. °· A surgery to widen blood vessels. °RISK FACTORS  °Risk factors are things that make it more likely for you to have a stroke. These include: °· High blood pressure. °· High cholesterol. °· Diabetes. °· Heart disease. °· Having a buildup of fatty deposits in the blood vessels. °· Having an abnormal heart rhythm (atrial fibrillation). °· Being very overweight (obese). °· Smoking. °· Taking birth control pills, especially if you smoke. °· Not being active. °· Having a diet that is high in fats, salt, and calories. °· Drinking too much alcohol. °· Using illegal drugs. °· Being African American. °· Being over the age of 55. °· Having a family history of stroke. °· Having a history of blood clots, stroke, warning stroke (transient ischemic attack, TIA), or heart attack. °· Sickle cell disease. °HOME CARE °· Take all medicines exactly as told by your doctor. Understand all your medicine instructions. °· If you are able to  swallow, eat healthy foods. Eat 5 or more servings of fruits and vegetables each day. Eat soft foods or pureed foods, or eat small bites of food so you do not choke. °· Stay active. Try to get at least 30 minutes of activity on most or all days. °· Do not smoke. °· Limit or stop alcohol use. °· Keep your home safe so you do not fall. Try: °¨ Putting grab bars in the bedroom and bathroom. °¨ Raising toilet seats. °¨ Putting a seat in the shower. °· Go to physical, occupational, and speech therapy sessions as told by your doctor. °· Use a walker or a cane at all times if told to do so. °· Keep all doctor visits as told. °GET HELP RIGHT AWAY IF:  °· You have sudden weakness or loss of feeling in your face, arm, or leg, especially on one side of your body. °· You are suddenly confused. °· You have trouble talking or understanding. °· You have sudden trouble seeing. °· You have sudden trouble walking or moving your arms or legs. °· You have sudden dizziness. °· You lose your balance or your movements are not smooth. °· You have a sudden, severe headache with no known cause. °· You are partly unaware or totally unaware of what is going on around you. °Any of these symptoms may be a sign of an emergency. Do not wait to see if the symptoms go away. Call for help (911 in [place] Do not   drive yourself to the hospital. °  °This information is not intended to replace advice given to you by your health care provider. Make sure you discuss any questions you have with your health care provider. °  °Document Released: 05/23/2014 Document Reviewed: 05/23/2014 °Elsevier Interactive Patient Education ©2016 [employer]. ° ° °

## 2015-12-10 NOTE — Progress Notes (Signed)
STROKE TEAM PROGRESS NOTE   HISTORY OF PRESENT ILLNESS Stacy Levy is a 82 y.o. female with hx of obesity and not on asa at baseline because of stomach upset. She was speaking with her daughter 2 days ago on the phone and suddenly developed slurred speech but decided to wait. Her speech did not improve by yesterday and she made appt with her PMD who sent her to the ED when he saw her in the office. She denies arm or leg weakness, changes in gait, difficulty swallowing. Denies diplopia, arm and hand clumsiness. She was last known well 12/05/2015. Patient was not administered IV t-PA secondary to delay in arrival. She was admitted for further evaluation and treatment.   SUBJECTIVE (INTERVAL HISTORY) No family is at the bedside.  Overall she feels her condition is stable.  Echocardiogram appears normal.Transcranial Doppler study was normal.  Carotid Dopplers do not show any significant extracranial stenosis.  OBJECTIVE Temp:  [97.8 F (36.6 C)-98.3 F (36.8 C)] 98.3 F (36.8 C) (04/20 1439) Pulse Rate:  [59-87] 67 (04/20 1439) Cardiac Rhythm:  [-] Heart block (04/20 0839) Resp:  [16-20] 20 (04/20 1439) BP: (98-159)/(52-79) 159/69 mmHg (04/20 1439) SpO2:  [95 %-97 %] 95 % (04/20 1439)  CBC:   Recent Labs Lab 12/08/15 1809 12/09/15 0519  WBC 6.3 6.3  NEUTROABS 3.7 3.3  HGB 11.8* 10.3*  HCT 37.3 34.3*  MCV 61.6* 60.9*  PLT 247 235    Basic Metabolic Panel:   Recent Labs Lab 12/08/15 1809 12/09/15 0519  NA 139 139  K 3.7 3.0*  CL 102 101  CO2 28 31  GLUCOSE 102* 108*  BUN 16 14  CREATININE 0.68 0.77  CALCIUM 9.6 9.1    Lipid Panel:     Component Value Date/Time   CHOL 179 12/08/2015 1809   TRIG 135 12/08/2015 1809   HDL 55 12/08/2015 1809   CHOLHDL 3.3 12/08/2015 1809   VLDL 27 12/08/2015 1809   LDLCALC 97 12/08/2015 1809   HgbA1c:  Lab Results  Component Value Date   HGBA1C 6.3* 12/09/2015   Urine Drug Screen: No results found for: LABOPIA,  COCAINSCRNUR, LABBENZ, AMPHETMU, THCU, LABBARB    IMAGING  Dg Chest 2 View 12/09/2015  Low volume film with vascular congestion.   Mr Brain Wo Contrast 12/08/2015  Acute small LEFT frontal lobe infarct. Moderate white matter changes compatible with chronic small vessel ischemic disease. Multiple old small cerebellar infarcts.   Carotid Doppler   There is 1-39% bilateral ICA stenosis. Vertebral artery flow is antegrade.     PHYSICAL EXAM Pleasant elderly lady not in distress. . Afebrile. Head is nontraumatic. Neck is supple without bruit.    Cardiac exam no murmur or gallop. Lungs are clear to auscultation. Distal pulses are well felt. Neurological Exam ;  Awake alert oriented x 3 normal speech and language. Mild left lower face asymmetry. Tongue midline. No drift. Mild diminished fine finger movements on right. Orbits left over right upper extremity. Mild right grip weakness.. Normal sensation . Normal coordination. ASSESSMENT/PLAN Ms. Stacy Levy is a 82 y.o. female with history of obesity presenting with dysarthria and R facial droop . She did not receive IV t-PA due to delay in arrival.   Stroke:  Small left frontal lobe infarct secondary to small vessel disease source  Resultant  R facial droop and dysarthria  MRI  Small L frontal lobe infarct  TCD normal  Carotid Doppler  No significant stenosis  2D Echo  Left ventricle: The cavity size was normal. There was mild concentric hypertrophy. Systolic function was vigorous. The estimated ejection fraction was in the range of 65% to 70%. Wall motion was normal; there were no regional wall motion  abnormalities  LDL 97  HgbA1c 6.3  Lovenox 40 mg sq daily for VTE prophylaxis Diet Heart Room service appropriate?: Yes; Fluid consistency:: Thin Diet - low sodium heart healthy Diet Carb Modified  No antithrombotic prior to admission, now on clopidogrel 75 mg daily  Patient counseled to be compliant with her antithrombotic  medications  Ongoing aggressive stroke risk factor management  Therapy recommendations:  pending    Disposition:  pending  (lives alone)  [facility] 2 months. Order written.  Hyperlipidemia  Home meds:  ZOCOR 20  LDL 97, goal < 70  Changed to lipitor 40 mg daily  Continue statin at discharge  Other Stroke Risk Factors  Advanced age  Former smoker, quit in 1975  Obesity, Body mass index is 38.2 kg/(m^2).   Hospital day # 1  Treyvin Glidden  [facility] See Amion for Pager information 12/10/2015 3:40 PM  I have personally examined this patient, reviewed notes, independently viewed imaging studies, participated in medical decision making and plan of care. I have made any additions or clarifications directly to the above note. Agree with note above.  He presented with slurred speech due to left brain subcortical infarct likely due to small vessel disease. I spent  25 minutes with this patient with greater than 50% of time  with counseling and coordination of care about stroke prevention aggressive risk factor modification.I had a long discussion with the patient  at the bedside and answered questions.Agree with clopidogrel for stroke prevention and strict control of lipids.Discharge home with outpatient therapy. Follow-up in the stroke clinic in 2 months or call earlier if necessary Delia HeadyPramod Makenli Derstine, MD Medical Director [facility] Pager: [phone] 12/10/2015 3:40 PM    To contact Stroke Continuity provider, please refer to [email or web address]. After hours, contact General Neurology

## 2015-12-14 ENCOUNTER — Ambulatory Visit: Payer: Commercial Managed Care - HMO | Attending: Internal Medicine | Admitting: Physical Therapy

## 2015-12-14 ENCOUNTER — Encounter: Payer: Self-pay | Admitting: Physical Therapy

## 2015-12-14 DIAGNOSIS — R2689 Other abnormalities of gait and mobility: Secondary | ICD-10-CM | POA: Insufficient documentation

## 2015-12-14 DIAGNOSIS — M6281 Muscle weakness (generalized): Secondary | ICD-10-CM

## 2015-12-14 NOTE — Therapy (Signed)
[facility] [address] [place], [place], [address] Phone: [phone]   Fax:  [phone]  Physical Therapy Evaluation  Patient Details  Name: Stacy Levy MRN: [id number] Date of Birth: November 18, 1931 Referring Provider: Trula Orehristina Rama Laurann Montana(Cynthia White - PCP)  Encounter Date: 12/14/2015      PT End of Session - 12/14/15 1216    Visit Number 1   Number of Visits 3   Date for PT Re-Evaluation 01/13/16   Authorization Type [employer]   Authorization Time Period 12-14-15 - 02-12-16   PT Start Time 1106   PT Stop Time 1250   PT Time Calculation (min) 104 min      Past Medical History  Diagnosis Date  . Obesity     Past Surgical History  Procedure Laterality Date  . Appendectomy    . Abdominal hysterectomy    . Foot surgery      There were no vitals filed for this visit.       Subjective Assessment - 12/14/15 1202    Subjective Pt reports symptom of slurred speech (dysarthria) happened last Monday, 12-07-15; pt saw MD on Tues.pm and MD [facility]: was discharged home on Thursday, 12-10-15:  Pt reports she is doing great, doesn't think she really needs PT                                                                                                                                Pertinent History prediabetes; OA in L knee - impacts gait pattern and mobility   Diagnostic tests MRI - revealed subcortical lacunar stroke   Patient Stated Goals pt states she doesn't have any goals - feels that she doesn't need PT   Currently in Pain? No/denies            [facility] - 12/14/15 1121    Assessment   Medical Diagnosis CVA   Referring Provider Christina Rama  Laurann Montanaynthia White - PCP   Onset Date/Surgical Date 12/07/15   Precautions   Precautions --  very slight fall risk   Balance Screen   Has the patient fallen in the past 6 months No   Has the patient had a decrease in activity level  because of a fear of falling?  No   Is the patient reluctant to leave their home because of a fear of falling?  No   Home Environment   Living Environment Private residence   Living Arrangements Alone  her son is currently staying with her until May 1   Type of Home House   Home Access Stairs to enter   Entrance Stairs-Number of Steps 3   Entrance Stairs-Rails Right   Home Layout One level   Home Equipment Shower seat - built in;Toilet riser   Prior Function   Level of Independence Independent with homemaking with ambulation;Independent with basic ADLs;Independent with household mobility without device;Independent  with gait   Vocation Retired   Leisure likes to travel; visits people   Cognition   Overall Cognitive Status Within Functional Limits for tasks assessed   Memory Appears intact  pt reports no memory problems since CVA   ROM / Strength   AROM / PROM / Strength Strength   Strength   Overall Strength Within functional limits for tasks performed   Overall Strength Comments uses UE support for sit to stand whcih pt says she has done for years   Strength Assessment Site Hip;Knee;Ankle   Ambulation/Gait   Ambulation/Gait Yes   Ambulation/Gait Assistance 6: Modified independent (Device/Increase time)   Ambulation Distance (Feet) 125 Feet   Assistive device None   Gait Pattern Antalgic  due to L knee OA   Ambulation Surface Level;Indoor   Gait velocity 2.93  no device; 11.19 secs   Stairs Yes   Stairs Assistance 6: Modified independent (Device/Increase time)   Stair Management Technique Two rails;Step to pattern   Number of Stairs 4   Height of Stairs 6   Timed Up and Go Test   Normal TUG (seconds) 14.34  pt reports she does not turn very fast (NOT NEW since CVA)                           PT Education - 12/14/15 1215    Education provided Yes   Education Details fall prevention handout   Person(s) Educated Patient   Methods Explanation;Handout    Comprehension Need further instruction             PT Long Term Goals - 12/14/15 1227    PT LONG TERM GOAL #1   Title Independent in HEP for strengthening and balance exercises, and walking program if tolerated.  (01-13-16)   Time 4   Period Weeks   Status New   PT LONG TERM GOAL #2   Title Verbalize understanding of home safety/fall prevention information - handout given on 12-14-15.  (01-13-16)   Time 4   Period Weeks   Status New   PT LONG TERM GOAL #3   Title Verbalize understanding of community exercise programs for aquatic therapy & Silver Sneakers.   Time 4   Period Weeks   Status New               Plan - 12/14/15 1217    Clinical Impression Statement Pt is an 82 year old lady with minimal mobility deficits since CVA on 12-07-15; pt initially experienced speech changes on 12-07-15 and went to see her MD on 12-08-15 who sent her to MD for MRI.  Pt reports pre-morbid mobiltiy problems due to OA in L knee which results in antalgic gait pattern.  Pt states she has a cane but does not use it.  Pt would benefit from HEP instruction for general strengthening and balance .   Rehab Potential Good   PT Frequency 1x / week   PT Duration 2 weeks   PT Treatment/Interventions ADLs/Self Care Home Management;Gait training;Stair training;Functional mobility training;Therapeutic activities;Therapeutic exercise;Balance training;Patient/family education;Neuromuscular re-education   PT Next Visit Plan Instruct in HEP ([place]?) and in high level balance exercises, i.e. SLS; instruct in walking program (if pt able to tolerate due to L knee OA); discuss aquatic therapy benefits; check understanding of  home safety info   PT Home Exercise Plan [place]? (Bridging, standing hip extension, sidelying hip abduction, heel raises, heel raises, sit to stand) and walking program  Recommended Other Services water exercise class?; Pt would benefit from [employer] program if her insurance covers it    Consulted and Agree with Plan of Care Patient      Patient will benefit from skilled therapeutic intervention in order to improve the following deficits and impairments:  Difficulty walking, Decreased balance, Abnormal gait, Decreased activity tolerance, Obesity  Visit Diagnosis: Other abnormalities of gait and mobility - Plan: PT plan of care cert/re-cert  Muscle weakness (generalized) - Plan: PT plan of care cert/re-cert      G-Codes - 01/12/2016 1230    Functional Assessment Tool Used TUG score 14.34 secs; Gait velocity 2.93 ft/sec with no device   Functional Limitation Mobility: Walking and moving around   Mobility: Walking and Moving Around Current Status [phone]) At least 20 percent but less than 40 percent impaired, limited or restricted   Mobility: Walking and Moving Around Goal Status [phone]) At least 1 percent but less than 20 percent impaired, limited or restricted       Problem List Patient Active Problem List   Diagnosis Date Noted  . Prediabetes 12/10/2015  . CVA (cerebral infarction) 12/09/2015  . Stroke (cerebrum) (HCC) 12/09/2015  . Obesity (BMI 30-39.9) 12/09/2015  . Dyslipidemia, goal LDL below 70 12/09/2015  . Hypokalemia 12/09/2015  . Cerebral infarction due to unspecified mechanism     Vollie Brunty, Donavan Burnet, PT 12-Jan-2016, 12:36 PM  [facility] [address] [place], [place], [address] Phone: [phone]   Fax:  [phone]  Name: Stacy Levy MRN: [id number] Date of Birth: November 19, 1931

## 2015-12-14 NOTE — Patient Instructions (Signed)
Fall Prevention in the Home  Falls can cause injuries and can affect people from all age groups. There are many simple things that you can do to make your home safe and to help prevent falls. WHAT CAN I DO ON THE OUTSIDE OF MY HOME?  Regularly repair the edges of walkways and driveways and fix any cracks.  Remove high doorway thresholds.  Trim any shrubbery on the main path into your home.  Use bright outdoor lighting.  Clear walkways of debris and clutter, including tools and rocks.  Regularly check that handrails are securely fastened and in good repair. Both sides of any steps should have handrails.  Install guardrails along the edges of any raised decks or porches.  Have leaves, snow, and ice cleared regularly.  Use sand or salt on walkways during winter months.  In the garage, clean up any spills right away, including grease or oil spills. WHAT CAN I DO IN THE BATHROOM?  Use night lights.  Install grab bars by the toilet and in the tub and shower. Do not use towel bars as grab bars.  Use non-skid mats or decals on the floor of the tub or shower.  If you need to sit down while you are in the shower, use a plastic, non-slip stool..  Keep the floor dry. Immediately clean up any water that spills on the floor.  Remove soap buildup in the tub or shower on a regular basis.  Attach bath mats securely with double-sided non-slip rug tape.  Remove throw rugs and other tripping hazards from the floor. WHAT CAN I DO IN THE BEDROOM?  Use night lights.  Make sure that a bedside light is easy to reach.  Do not use oversized bedding that drapes onto the floor.  Have a firm chair that has side arms to use for getting dressed.  Remove throw rugs and other tripping hazards from the floor. WHAT CAN I DO IN THE KITCHEN?   Clean up any spills right away.  Avoid walking on wet floors.  Place frequently used items in easy-to-reach places.  If you need to reach for something  above you, use a sturdy step stool that has a grab bar.  Keep electrical cables out of the way.  Do not use floor polish or wax that makes floors slippery. If you have to use wax, make sure that it is non-skid floor wax.  Remove throw rugs and other tripping hazards from the floor. WHAT CAN I DO IN THE STAIRWAYS?  Do not leave any items on the stairs.  Make sure that there are handrails on both sides of the stairs. Fix handrails that are broken or loose. Make sure that handrails are as long as the stairways.  Check any carpeting to make sure that it is firmly attached to the stairs. Fix any carpet that is loose or worn.  Avoid having throw rugs at the top or bottom of stairways, or secure the rugs with carpet tape to prevent them from moving.  Make sure that you have a light switch at the top of the stairs and the bottom of the stairs. If you do not have them, have them installed. WHAT ARE SOME OTHER FALL PREVENTION TIPS?  Wear closed-toe shoes that fit well and support your feet. Wear shoes that have rubber soles or low heels.  When you use a stepladder, make sure that it is completely opened and that the sides are firmly locked. Have someone hold the ladder while you   are using it. Do not climb a closed stepladder.  Add color or contrast paint or tape to grab bars and handrails in your home. Place contrasting color strips on the first and last steps.  Use mobility aids as needed, such as canes, walkers, scooters, and crutches.  Turn on lights if it is dark. Replace any light bulbs that burn out.  Set up furniture so that there are clear paths. Keep the furniture in the same spot.  Fix any uneven floor surfaces.  Choose a carpet design that does not hide the edge of steps of a stairway.  Be aware of any and all pets.  Review your medicines with your healthcare provider. Some medicines can cause dizziness or changes in blood pressure, which increase your risk of falling. Talk  with your health care provider about other ways that you can decrease your risk of falls. This may include working with a physical therapist or trainer to improve your strength, balance, and endurance.   This information is not intended to replace advice given to you by your health care provider. Make sure you discuss any questions you have with your health care provider.   Document Released: 07/29/2002 Document Revised: 12/23/2014 Document Reviewed: 09/12/2014 Elsevier Interactive Patient Education 2016 Elsevier Inc.  

## 2015-12-23 ENCOUNTER — Ambulatory Visit: Payer: Commercial Managed Care - HMO | Attending: Internal Medicine | Admitting: Physical Therapy

## 2015-12-23 ENCOUNTER — Encounter: Payer: Self-pay | Admitting: Physical Therapy

## 2015-12-23 DIAGNOSIS — M6281 Muscle weakness (generalized): Secondary | ICD-10-CM | POA: Diagnosis not present

## 2015-12-23 DIAGNOSIS — R2689 Other abnormalities of gait and mobility: Secondary | ICD-10-CM

## 2015-12-23 NOTE — Therapy (Signed)
[facility] [address] [place], [place], [address] Phone: [phone]   Fax:  [phone]  Physical Therapy Treatment  Patient Details  Name: Stacy Levy MRN: [id number] Date of Birth: 1931-10-11 Referring Provider: Trula Orehristina Rama Laurann Montana(Cynthia White - PCP)  Encounter Date: 12/23/2015      PT End of Session - 12/23/15 1359    Visit Number 2   Number of Visits 3   Date for PT Re-Evaluation 01/13/16   Authorization Type [employer]   Authorization Time Period 12-14-15 - 02-12-16   PT Start Time 1322   PT Stop Time 1400   PT Time Calculation (min) 38 min      Past Medical History  Diagnosis Date  . Obesity     Past Surgical History  Procedure Laterality Date  . Appendectomy    . Abdominal hysterectomy    . Foot surgery      There were no vitals filed for this visit.      Subjective Assessment - 12/23/15 1321    Subjective [facility]   Pertinent History prediabetes; OA in L knee - impacts gait pattern and mobility   Diagnostic tests MRI - revealed subcortical lacunar stroke   Patient Stated Goals pt states she doesn't have any goals - feels that she doesn't need PT   Currently in Pain? No/denies                    Performed LE strengthening as seen in handout below. 10x2 each with min cues to perform AROM.          Balance Exercises - 12/23/15 1348    Balance Exercises: Standing   Tandem Gait Forward;Intermittent upper extremity support;4 reps   Sidestepping 4 reps   Marching Limitations x4 progressing from 1 to no UE support   Overall Comments Intermittent upper extremity support  performed above at an overall supervision level.           PT Education - 12/23/15 1336    Education provided Yes   Education Details HEP for LE strengthening   Person(s) Educated Patient   Methods Explanation;Demonstration;Tactile cues;Verbal  cues;Handout   Comprehension Verbalized understanding;Returned demonstration;Verbal cues required;Tactile cues required;Need further instruction             PT Long Term Goals - 12/14/15 1227    PT LONG TERM GOAL #1   Title Independent in HEP for strengthening and balance exercises, and walking program if tolerated.  (01-13-16)   Time 4   Period Weeks   Status New   PT LONG TERM GOAL #2   Title Verbalize understanding of home safety/fall prevention information - handout given on 12-14-15.  (01-13-16)   Time 4   Period Weeks   Status New   PT LONG TERM GOAL #3   Title Verbalize understanding of community exercise programs for aquatic therapy & Silver Sneakers.   Time 4   Period Weeks   Status New               Plan - 12/23/15 1400    Clinical Impression Statement Initiated HEP for LE strengthening  and dynamic standing balance activities; pt requires intermittent UE support for narrow BOS.   Rehab Potential Good   PT Frequency 1x / week   PT Duration 2 weeks   PT Treatment/Interventions ADLs/Self Care Home Management;Gait training;Stair training;Functional mobility training;Therapeutic activities;Therapeutic exercise;Balance training;Patient/family education;Neuromuscular re-education   PT  Next Visit Plan Instruct in HEP [facility]?) and in high level balance exercises, i.e. SLS; instruct in walking program (if pt able to tolerate due to L knee OA); discuss aquatic therapy benefits; check understanding of  home safety info   PT Home Exercise Plan [place]taga? Check HEP (Bridging, standing hip extension, sidelying hip abduction, heel raises, heel raises, sit to stand) and walking program   Consulted and Agree with Plan of Care Patient      Patient will benefit from skilled therapeutic intervention in order to improve the following deficits and impairments:  Difficulty walking, Decreased balance, Abnormal gait, Decreased activity tolerance, Obesity  Visit Diagnosis: Other  abnormalities of gait and mobility  Muscle weakness (generalized)     Problem List Patient Active Problem List   Diagnosis Date Noted  . Prediabetes 12/10/2015  . CVA (cerebral infarction) 12/09/2015  . Stroke (cerebrum) (HCC) 12/09/2015  . Obesity (BMI 30-39.9) 12/09/2015  . Dyslipidemia, goal LDL below 70 12/09/2015  . Hypokalemia 12/09/2015  . Cerebral infarction due to unspecified mechanism     Hortencia Conradi, PTA  12/23/2015, 2:04 PM [facility] [address] [place], [place], [address] Phone: [phone]   Fax:  [phone]  Name: Stacy Levy MRN: [id number] Date of Birth: August 30, 1931

## 2015-12-23 NOTE — Patient Instructions (Addendum)
Bridging    Slowly raise buttocks from floor, keeping stomach tight. Repeat _10___ times per set. Do __2__ sets per session. Do __1-2__ sessions per day.  http://orth.exer.us/1097   Copyright  VHI. All rights reserved.  ABDUCTION: Side-Lying (Active)    Lie on left side, top leg straight. Raise top leg as far as possible.  Complete _2__ sets of __10_ repetitions. Perform _1-2__ sessions per day.  http://gtsc.exer.us/95   Heel Raises    Stand with support. Tighten pelvic floor and hold. With knees straight, raise heels off ground.  Repeat _10x2__ times. Do _1-2__ times a day.  Copyright  VHI. All rights reserved.  Functional Quadriceps: Sit to Stand    Sit on edge of chair, feet flat on floor. Stand upright, extending knees fully. Repeat __10__ times per set. Do __2__ sets per session. Do __1-2__ sessions per day.  http://orth.exer.us/735   Copyright  VHI. All rights reserved.

## 2015-12-31 ENCOUNTER — Ambulatory Visit: Payer: Commercial Managed Care - HMO | Admitting: Physical Therapy

## 2016-02-05 ENCOUNTER — Ambulatory Visit (INDEPENDENT_AMBULATORY_CARE_PROVIDER_SITE_OTHER): Payer: Commercial Managed Care - HMO | Admitting: Nurse Practitioner

## 2016-02-05 ENCOUNTER — Encounter: Payer: Self-pay | Admitting: Nurse Practitioner

## 2016-02-05 VITALS — BP 136/80 | HR 76 | Resp 18 | Wt 221.4 lb

## 2016-02-05 DIAGNOSIS — R7303 Prediabetes: Secondary | ICD-10-CM | POA: Diagnosis not present

## 2016-02-05 DIAGNOSIS — E785 Hyperlipidemia, unspecified: Secondary | ICD-10-CM | POA: Diagnosis not present

## 2016-02-05 DIAGNOSIS — I6350 Cerebral infarction due to unspecified occlusion or stenosis of unspecified cerebral artery: Secondary | ICD-10-CM

## 2016-02-05 DIAGNOSIS — I639 Cerebral infarction, unspecified: Secondary | ICD-10-CM

## 2016-02-05 NOTE — Progress Notes (Signed)
I reviewed above note and agree with the assessment and plan. One thing I would like to mention is that her infarct at left frontal is at M2/M3 territory, not typical for small vessel disease. In addition, she does not have significant small vessel stroke risk factors. Would recommend to consider 30 day cardiac event monitoring to rule out afib.   Marvel PlanJindong Joriel Streety, MD PhD Stroke Neurology 02/05/2016 4:38 PM

## 2016-02-05 NOTE — Patient Instructions (Signed)
[facility]. I hope we have been able to provide you with high quality care today You may receive a patient's satisfaction survey over the next few weeks we would appreciate your feedback and comments so that we can continue to improve ourselves and the  health of our patients  Stressed the importance of management of risk factors to prevent further stroke Continue Plavix for secondary stroke prevention Maintain strict control of hypertension with blood pressure goal below 130/90, today's reading 136/80 continue antihypertensive medications Control of diabetes with hemoglobin A1c below 6.5 followed by primary care most recent hemoglobin A1c6.3 Cholesterol with LDL cholesterol less than 70, followed by primary care,  most recent 97 continue Lipitor Exercise by walking, slowly increase , eat healthy diet with whole grains,  fresh fruits and vegetables Discussed risk for recurrent stroke/ TIA and answered additional questions Follow up in 3 months

## 2016-02-05 NOTE — Progress Notes (Signed)
[facility]  PATIENT: Stacy Levy DOB: 10/06/1931   REASON FOR VISIT: Hospital follow-up for stroke HISTORY FROM: Patient    HISTORY OF PRESENT ILLNESS:Stacy Levy is a 82 y.o. female with hx of obesity and not on asa at baseline because of stomach upset. She was speaking with her friend 12/08/15 on the phone and suddenly developed slurred speech but decided to wait. Her speech did not improve  and she made appt with her PMD who sent her to the ED when he saw her in the office. She denies arm or leg weakness, changes in gait, difficulty swallowing. Denies diplopia, arm and hand clumsiness. She was last known well 12/05/2015. Patient was not administered IV t-PA secondary to delay in arrival. She was admitted for further evaluation and treatment.Echocardiogram appears normal.Transcranial Doppler study was normal. Carotid Dopplers do not show any significant extracranial stenosis.MRI Small L frontal lobe infarct. LDL 97, HbdA1C 6.3 Stacy Levy  is here for hospital f/u of stroke.   She was started on Plavix and sts. she tolerates it well.  She is also on Lipitor and Diovan. Denies further CVA sx. She is back to her usual activities she returns for reevaluation   REVIEW OF SYSTEMS: Full 14 system review of systems performed and notable only for those listed, all others are neg:  Constitutional: neg  Cardiovascular: neg Ear/Nose/Throat: neg  Skin: neg Eyes: neg Respiratory: neg Gastroitestinal: neg  Hematology/Lymphatic: neg  Endocrine: neg Musculoskeletal:neg Allergy/Immunology: neg Neurological: neg Psychiatric: neg Sleep : neg   ALLERGIES: Allergies  Allergen Reactions  . Aspirin Other (See Comments)    Upset stomach     HOME MEDICATIONS: Outpatient Prescriptions Prior to Visit  Medication Sig Dispense Refill  . atorvastatin (LIPITOR) 40 MG tablet Take 1 tablet (40 mg total) by mouth daily at 6 PM. 30 tablet 3  . clopidogrel (PLAVIX) 75 MG tablet  Take 1 tablet (75 mg total) by mouth daily. 30 tablet 3  . guaiFENesin (ROBITUSSIN) 100 MG/5ML liquid Take 200 mg by mouth 3 (three) times daily as needed for cough or congestion.    . Multiple Vitamin (THERA) TABS Take 1 tablet by mouth daily.    . valsartan-hydrochlorothiazide (DIOVAN-HCT) 320-25 MG tablet Take 1 tablet by mouth daily.     No facility-administered medications prior to visit.    PAST MEDICAL HISTORY: Past Medical History  Diagnosis Date  . Obesity     PAST SURGICAL HISTORY: Past Surgical History  Procedure Laterality Date  . Appendectomy    . Abdominal hysterectomy    . Foot surgery      FAMILY HISTORY: History reviewed. No pertinent family history.  SOCIAL HISTORY: Social History   Social History  . Marital Status: Widowed    Spouse Name: N/A  . Number of Children: N/A  . Years of Education: N/A   Occupational History  . Not on file.   Social History Main Topics  . Smoking status: Never Smoker   . Smokeless tobacco: Not on file  . Alcohol Use: No  . Drug Use: Not on file  . Sexual Activity: Not on file   Other Topics Concern  . Not on file   Social History Narrative     PHYSICAL EXAM  Filed Vitals:   02/05/16 1042  BP: 136/80  Pulse: 76  Resp: 18  Weight: 221 lb 6.4 oz (100.426 kg)   Body mass index is 37.98 kg/(m^2).  Generalized: Well developed, in no acute distress  Head:  normocephalic and atraumatic,. Oropharynx benign  Neck: Supple, no carotid bruits  Cardiac: Regular rate rhythm, no murmur  Musculoskeletal: No deformity   Neurological examination   Mentation: Alert oriented to time, place, history taking. Attention span and concentration appropriate. Recent and remote memory intact.  Follows all commands speech and language fluent.   Cranial nerve II-XII: Fundoscopic exam reveals sharp disc margins.Pupils were equal round reactive to light extraocular movements were full, visual field were full on confrontational test.  Facial sensation and strength were normal. hearing was intact to finger rubbing bilaterally. Uvula tongue midline. head turning and shoulder shrug were normal and symmetric.Tongue protrusion into cheek strength was normal. Motor: normal bulk and tone, full strength in the BUE, BLE, fine finger movements normal, no pronator drift. No focal weakness Sensory: normal and symmetric to light touch,  Coordination: finger-nose-finger, heel-to-shin bilaterally, no dysmetria Reflexes: Symmetric upper and lower, plantar responses were flexor bilaterally. Gait and Station: Rising up from seated position without assistance, normal stance,  moderate stride, good arm swing, smooth turning, able to perform tiptoe, and heel walking without difficulty. Tandem gait is steady. No assistive device  DIAGNOSTIC DATA (LABS, IMAGING, TESTING) - I reviewed patient records, labs, notes, testing and imaging myself where available.  Lab Results  Component Value Date   WBC 6.3 12/09/2015   HGB 10.3* 12/09/2015   HCT 34.3* 12/09/2015   MCV 60.9* 12/09/2015   PLT 235 12/09/2015      Component Value Date/Time   NA 139 12/09/2015 0519   K 3.0* 12/09/2015 0519   CL 101 12/09/2015 0519   CO2 31 12/09/2015 0519   GLUCOSE 108* 12/09/2015 0519   BUN 14 12/09/2015 0519   CREATININE 0.77 12/09/2015 0519   CALCIUM 9.1 12/09/2015 0519   PROT 7.4 12/08/2015 1809   ALBUMIN 4.1 12/08/2015 1809   AST 21 12/08/2015 1809   ALT 17 12/08/2015 1809   ALKPHOS 58 12/08/2015 1809   BILITOT 0.7 12/08/2015 1809   GFRNONAA >60 12/09/2015 0519   GFRAA >60 12/09/2015 0519   Lab Results  Component Value Date   CHOL 179 12/08/2015   HDL 55 12/08/2015   LDLCALC 97 12/08/2015   TRIG 135 12/08/2015   CHOLHDL 3.3 12/08/2015   Lab Results  Component Value Date   HGBA1C 6.3* 12/09/2015    ASSESSMENT AND PLAN  82 y.o. year old female  has a past medical history of Small L frontal lobe infarct.Echocardiogram appears  normal.Transcranial Doppler study was normal. Carotid Dopplers do not show any significant extracranial stenosis.LDL 97, HbdA1C 6.3. She has not had further stroke or TIA symptoms since discharge. She is currently on Plavix as she is allergic to aspirin. The patient is a current patient of Dr. Pearlean Brownie  who is out of the office today . This note is sent to the work in doctor.      PLAN: Stressed the importance of management of risk factors to prevent further stroke Continue Plavix for secondary stroke prevention Maintain strict control of hypertension with blood pressure goal below 130/90, today's reading 136/80 continue antihypertensive medications Control of diabetes with hemoglobin A1c below 6.5 followed by primary care most recent hemoglobin A1c6.3 Cholesterol with LDL cholesterol less than 70, followed by primary care,  most recent 97 continue Lipitor Exercise by walking, slowly increase , eat healthy diet with whole grains,  fresh fruits and vegetables Discussed risk for recurrent stroke/ TIA and answered additional questions Follow up in 3 months Vst time 30 min Dale Sawpit  Karma GanjaMartin, GNP, [facility], APRN  [facility] [address], [address] [place], [place] [address] [phone]

## 2016-02-08 ENCOUNTER — Telehealth: Payer: Self-pay | Admitting: Nurse Practitioner

## 2016-02-08 NOTE — Telephone Encounter (Signed)
Called patient to relay that her apt was this Thursday at [facility] at 9:00 am . Patient said she want's a call stating why she is having  Cardiac monitor.

## 2016-02-08 NOTE — Telephone Encounter (Signed)
Rn call patient back about her 30 day cardiac monitor. Pt stated she dont know why this is being done. Pt stated " [facility] and [facility] NP,why was I not told at my appt." RN stated Dr.Xu sent Eber JonesCarolyn a note suggesting a 30 day cardiac monitor. 'Pt stated It took me a long time to find [facility]." Pt also stated " I'm 82 years old and dont have a lot of money, I had to pay 45.00 last week. If its a co payment, it will have to wait till July 2017". Rn gave patient directions and phone number to call [facility] for co payment on 02/09/2016.

## 2016-02-08 NOTE — Telephone Encounter (Signed)
Dr. Roda ShuttersXu wants this patient to have cardiac monitoring for 30 days. I saw her last week. I have placed the order with [facility] [address] They will call to schedule. Please call the patient.

## 2016-02-08 NOTE — Telephone Encounter (Signed)
PATIENT called and made aware that Dr. Roda ShuttersXu is wanting to make sure she does not have atrial fibrillation and that is the reason for cardiac 30 monitoring.  She claims she may have to wait until next month to schedule if she has a co-pay.

## 2016-02-08 NOTE — Addendum Note (Signed)
Addended by: Beverely LowMARTIN, Allora Bains on: 02/08/2016 04:33 PM   Modules accepted: Orders

## 2016-02-10 ENCOUNTER — Other Ambulatory Visit: Payer: Self-pay | Admitting: Nurse Practitioner

## 2016-02-10 DIAGNOSIS — I639 Cerebral infarction, unspecified: Secondary | ICD-10-CM

## 2016-02-10 DIAGNOSIS — I4891 Unspecified atrial fibrillation: Secondary | ICD-10-CM

## 2016-02-10 DIAGNOSIS — I6350 Cerebral infarction due to unspecified occlusion or stenosis of unspecified cerebral artery: Secondary | ICD-10-CM

## 2016-02-11 ENCOUNTER — Ambulatory Visit: Payer: Commercial Managed Care - HMO

## 2016-02-11 ENCOUNTER — Encounter: Payer: Self-pay | Admitting: *Deleted

## 2016-02-11 NOTE — Progress Notes (Signed)
Patient ID: Stacy Levy, female   DOB: 1932/02/08, 82 y.o.   MRN: [id number] Patient did not show up for 02/11/16, 9:00 AM, appointment to have a cardiac event monitor applied.

## 2016-02-11 NOTE — Progress Notes (Signed)
Hi, Katrina:  Could you please call her and ask her to reschedule with cardiology for 30 day cardiac event monitoring set up? Thanks.  Marvel PlanJindong Ciella Obi, MD PhD Stroke Neurology 02/11/2016 11:05 PM

## 2016-02-15 NOTE — Progress Notes (Signed)
Rn call patient back about rescheduling her 30 day cardiac monitor. Rn stated she had an appt last week and was a no show. Rn gave patient number of [phone] to reschedule. Pt stated she will call today to reschedule for sometime in July 2017. [facility] about [facility] scheduling heart monitor.

## 2016-02-17 ENCOUNTER — Ambulatory Visit (INDEPENDENT_AMBULATORY_CARE_PROVIDER_SITE_OTHER): Payer: Commercial Managed Care - HMO

## 2016-02-17 DIAGNOSIS — I6350 Cerebral infarction due to unspecified occlusion or stenosis of unspecified cerebral artery: Secondary | ICD-10-CM

## 2016-02-17 DIAGNOSIS — I639 Cerebral infarction, unspecified: Secondary | ICD-10-CM | POA: Diagnosis not present

## 2016-02-17 DIAGNOSIS — I4891 Unspecified atrial fibrillation: Secondary | ICD-10-CM

## 2016-02-25 DIAGNOSIS — R7309 Other abnormal glucose: Secondary | ICD-10-CM | POA: Diagnosis not present

## 2016-02-25 DIAGNOSIS — E785 Hyperlipidemia, unspecified: Secondary | ICD-10-CM | POA: Diagnosis not present

## 2016-02-25 DIAGNOSIS — I10 Essential (primary) hypertension: Secondary | ICD-10-CM | POA: Diagnosis not present

## 2016-02-25 DIAGNOSIS — B372 Candidiasis of skin and nail: Secondary | ICD-10-CM | POA: Diagnosis not present

## 2016-02-25 DIAGNOSIS — Z8673 Personal history of transient ischemic attack (TIA), and cerebral infarction without residual deficits: Secondary | ICD-10-CM | POA: Diagnosis not present

## 2016-03-28 ENCOUNTER — Telehealth: Payer: Self-pay | Admitting: *Deleted

## 2016-03-28 NOTE — Telephone Encounter (Signed)
-----   Message from Nilda RiggsNancy Carolyn Martin, NP sent at 03/28/2016  2:31 PM EDT ----- Cardiac monitoring did not show any atrial fibrillation and no sustained arrhythmias. Please call the patient

## 2016-03-28 NOTE — Telephone Encounter (Signed)
Spoke to pt and relayed that the test for cardiac event monitor did not show Afib or other sustained arrhythmias.  Pt verbalized understanding.

## 2016-05-11 ENCOUNTER — Ambulatory Visit: Payer: Commercial Managed Care - HMO | Admitting: Nurse Practitioner

## 2016-06-30 DIAGNOSIS — N898 Other specified noninflammatory disorders of vagina: Secondary | ICD-10-CM | POA: Diagnosis not present

## 2016-06-30 DIAGNOSIS — M25551 Pain in right hip: Secondary | ICD-10-CM | POA: Diagnosis not present

## 2016-07-01 ENCOUNTER — Other Ambulatory Visit: Payer: Self-pay | Admitting: Family Medicine

## 2016-07-01 ENCOUNTER — Ambulatory Visit
Admission: RE | Admit: 2016-07-01 | Discharge: 2016-07-01 | Disposition: A | Discharge status: Home / Self Care | Payer: Commercial Managed Care - HMO | Source: Ambulatory Visit | Attending: Family Medicine | Admitting: Family Medicine

## 2016-07-01 DIAGNOSIS — M25551 Pain in right hip: Secondary | ICD-10-CM

## 2016-09-20 DIAGNOSIS — H52229 Regular astigmatism, unspecified eye: Secondary | ICD-10-CM | POA: Diagnosis not present

## 2016-09-27 DIAGNOSIS — E049 Nontoxic goiter, unspecified: Secondary | ICD-10-CM | POA: Diagnosis not present

## 2016-09-27 DIAGNOSIS — M2550 Pain in unspecified joint: Secondary | ICD-10-CM | POA: Diagnosis not present

## 2016-09-27 DIAGNOSIS — E785 Hyperlipidemia, unspecified: Secondary | ICD-10-CM | POA: Diagnosis not present

## 2016-09-27 DIAGNOSIS — Z8673 Personal history of transient ischemic attack (TIA), and cerebral infarction without residual deficits: Secondary | ICD-10-CM | POA: Diagnosis not present

## 2016-09-27 DIAGNOSIS — I10 Essential (primary) hypertension: Secondary | ICD-10-CM | POA: Diagnosis not present

## 2016-09-27 DIAGNOSIS — Z0000 Encounter for general adult medical examination without abnormal findings: Secondary | ICD-10-CM | POA: Diagnosis not present

## 2016-09-27 DIAGNOSIS — R7309 Other abnormal glucose: Secondary | ICD-10-CM | POA: Diagnosis not present

## 2016-10-03 IMAGING — MR MR HEAD W/O CM
8 of 10 series · 35 of 48 positions shown · non-contrast
Comparison: None.

CLINICAL DATA: Slurred speech beginning yesterday at 11 a.m.
History of obesity.

EXAM:
MRI HEAD WITHOUT CONTRAST
TECHNIQUE: Multiplanar, multiecho pulse sequences of the brain and surrounding
structures were obtained without intravenous contrast.

[Series 5: DWI · axial · 3.0mm · 1.09mm/px · z∈[-35,+101]mm · 8 of 94 slices shown (1 of 4)]
[im 1/94]
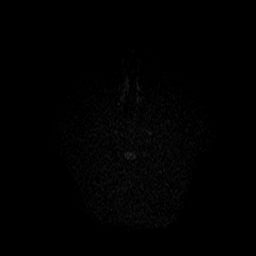
[im 14/94]
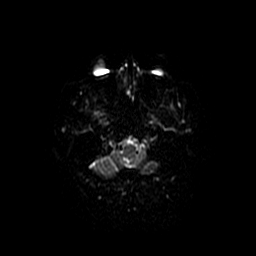
[im 27/94]
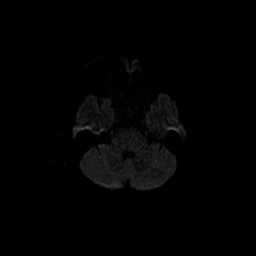
[im 40/94]
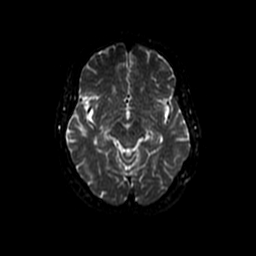
[im 54/94]
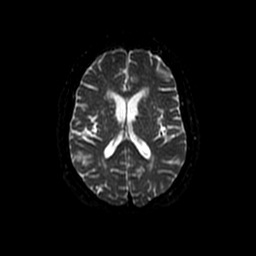
[im 67/94]
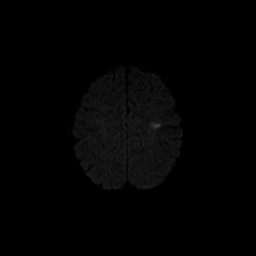
[im 80/94]
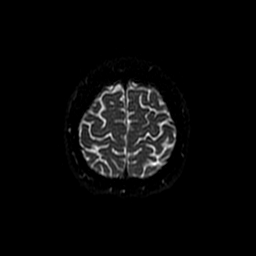
[im 94/94]
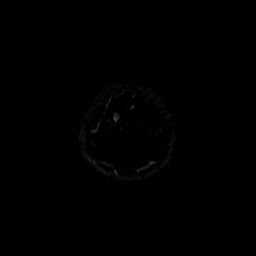

[Series 6: T1 · sagittal · 5.0mm · 0.47mm/px · 3 of 26 slices shown]
[im 1/26]
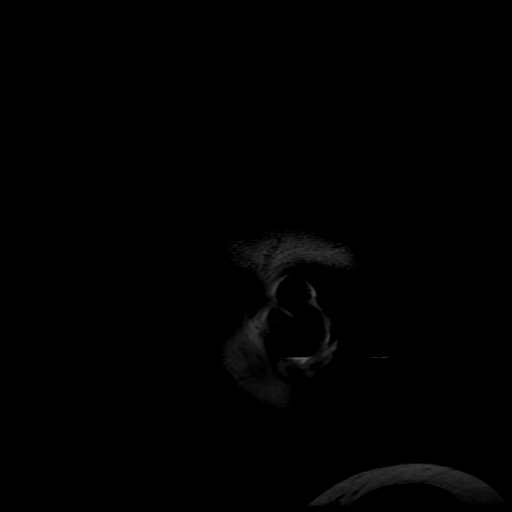
[im 13/26]
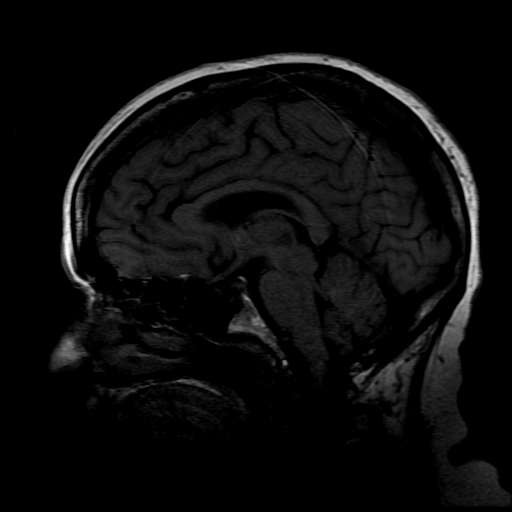
[im 26/26]
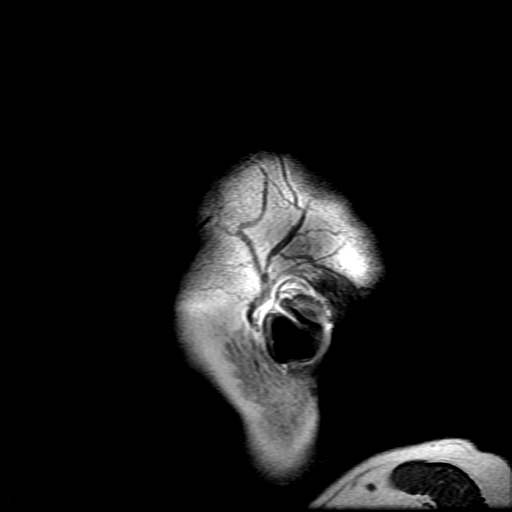

[Series 7: DWI · coronal · 5.0mm · 1.09mm/px · 7 of 66 slices shown (2 of 4)]
[im 1/66]
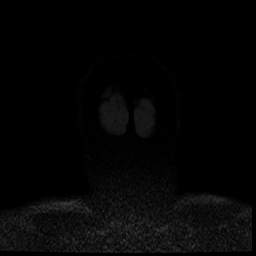
[im 11/66]
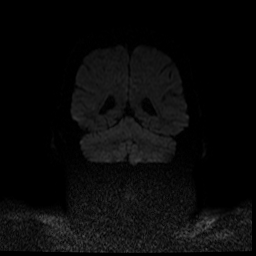
[im 22/66]
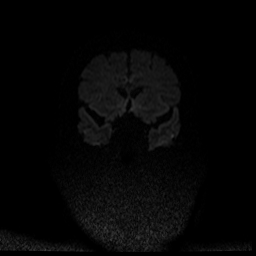
[im 33/66]
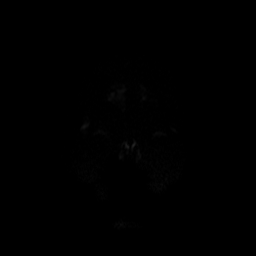
[im 44/66]
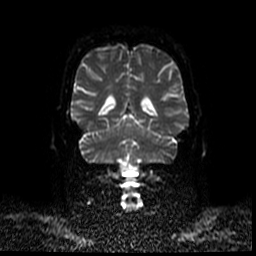
[im 55/66]
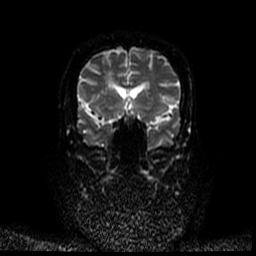
[im 66/66]
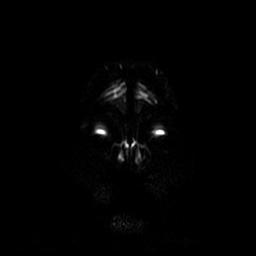

[Series 8: T2 · axial · 5.0mm · 0.43mm/px · z∈[-53,+95]mm · 3 of 26 slices shown (1 of 2)]
[im 1/26]
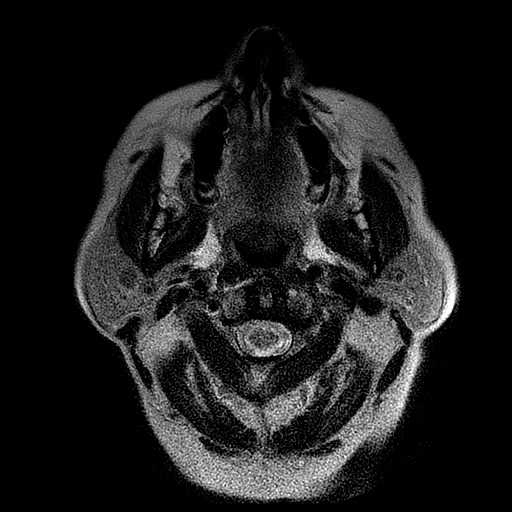
[im 13/26]
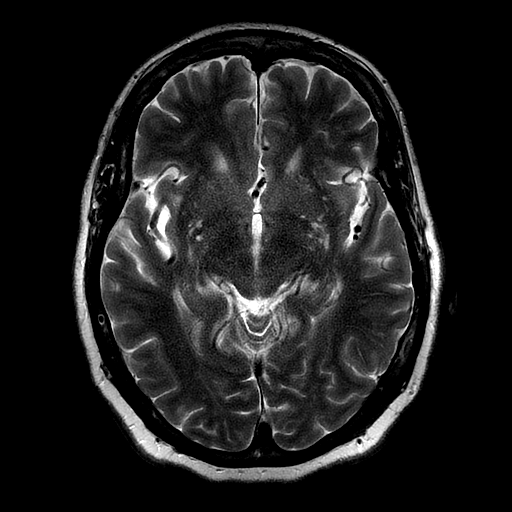
[im 26/26]
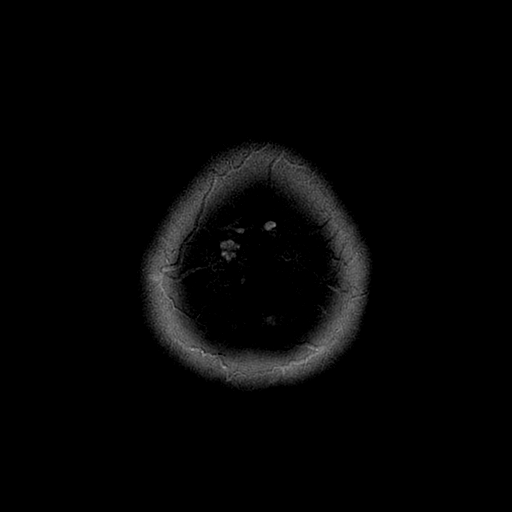

[Series 9: FLAIR · axial · 5.0mm · 0.43mm/px · z∈[-53,+95]mm · 3 of 26 slices shown]
[im 1/26]
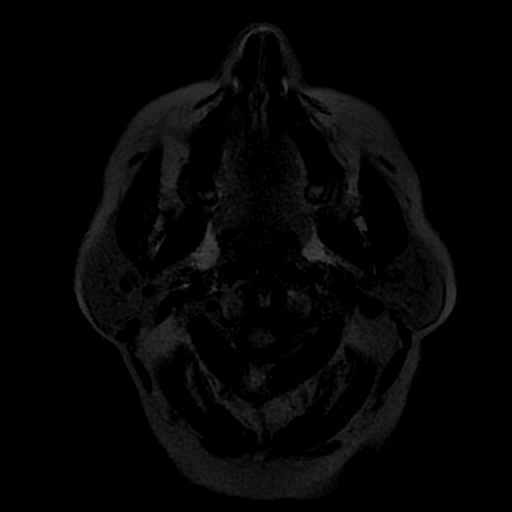
[im 13/26]
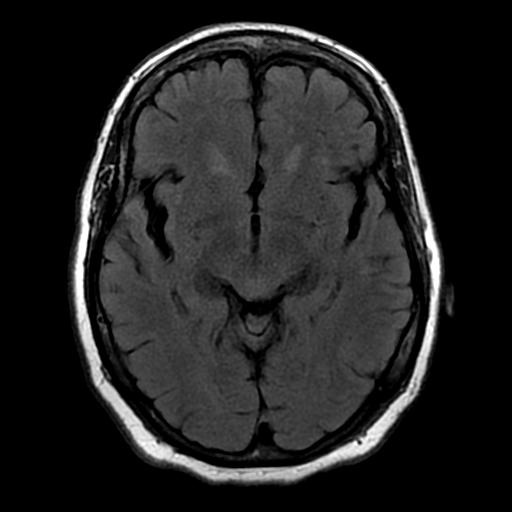
[im 26/26]
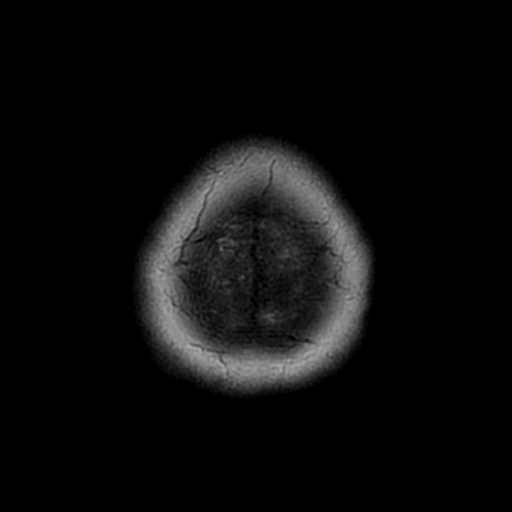

[Series 12: T2 · coronal · 5.0mm · 0.43mm/px · 3 of 28 slices shown (2 of 2)]
[im 1/28]
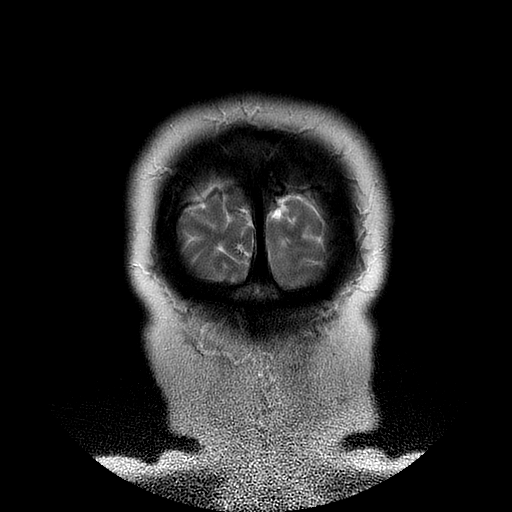
[im 14/28]
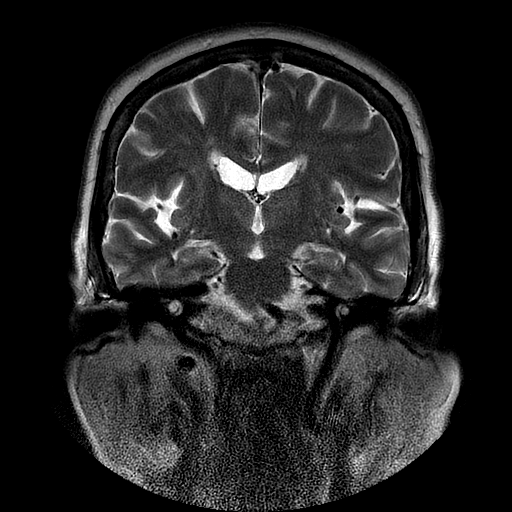
[im 28/28]
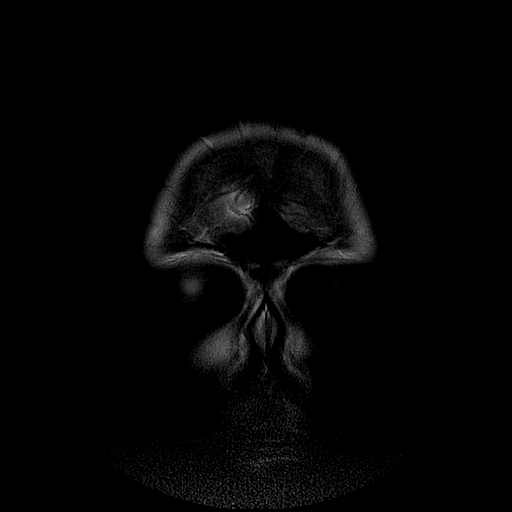

[Series 500: DWI · axial · 3.0mm · 1.09mm/px · z∈[-35,+101]mm · 5 of 47 slices shown (3 of 4)]
[im 1/47]
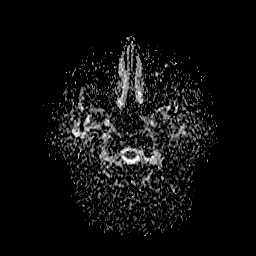
[im 12/47]
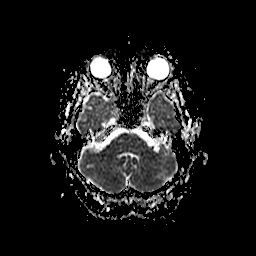
[im 24/47]
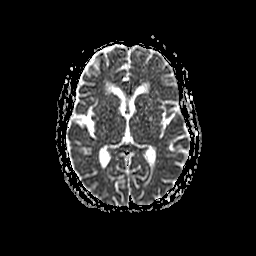
[im 35/47]
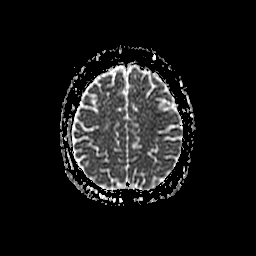
[im 47/47]
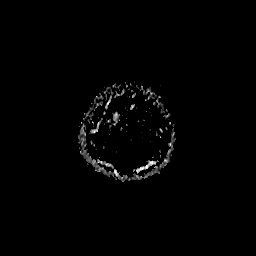

[Series 700: DWI · coronal · 5.0mm · 1.09mm/px · 3 of 33 slices shown (4 of 4)]
[im 1/33]
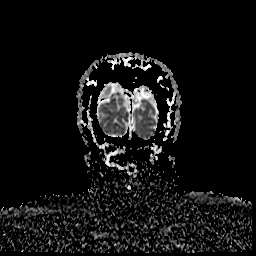
[im 17/33]
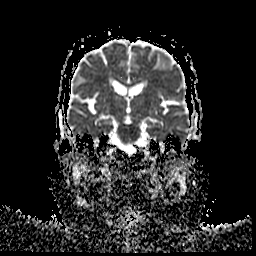
[im 33/33]
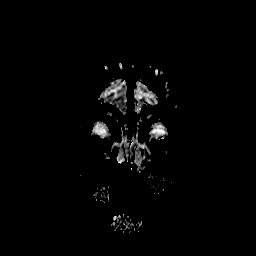

[35 of 48 positions shown; findings below may reference images not displayed]

FINDINGS: INTRACRANIAL CONTENTS: 13 x 6 mm focus of reduced diffusion LEFT
posterior frontal lobe subcortical white matter with low ADC value,
no significant FLAIR abnormality. Punctate focus of susceptibility
artifact in LEFT frontal precentral gyrus is nonspecific. The
ventricles and sulci are normal for patient's age. Multiple small
cerebellar infarcts. Patchy supratentorial white matter FLAIR T2
hyperintensities. No suspicious parenchymal signal, mass lesions,
mass effect. No abnormal extra-axial fluid collections. No
extra-axial masses though, contrast enhanced sequences would be more
sensitive. Major intracranial vascular flow voids present at skull
base, dolichoectasia most compatible with chronic hypertension.

ORBITS: The included ocular globes and orbital contents are
non-suspicious. Status post bilateral ocular lens implants. Probable
colobomas bilaterally.

SINUSES: The mastoid air-cells and included paranasal sinuses are
well-aerated.

SKULL/SOFT TISSUES: No abnormal sellar expansion. No suspicious
calvarial bone marrow signal. Craniocervical junction maintained.
Patient is edentulous.
IMPRESSION: Acute small LEFT frontal lobe infarct.

Moderate white matter changes compatible with chronic small vessel
ischemic disease. Multiple old small cerebellar infarcts.

## 2016-10-05 DIAGNOSIS — M1712 Unilateral primary osteoarthritis, left knee: Secondary | ICD-10-CM | POA: Diagnosis not present

## 2016-10-05 DIAGNOSIS — M1611 Unilateral primary osteoarthritis, right hip: Secondary | ICD-10-CM | POA: Diagnosis not present

## 2016-10-10 ENCOUNTER — Other Ambulatory Visit: Payer: Self-pay | Admitting: Family Medicine

## 2016-10-10 DIAGNOSIS — Z1231 Encounter for screening mammogram for malignant neoplasm of breast: Secondary | ICD-10-CM

## 2016-11-17 DIAGNOSIS — M1611 Unilateral primary osteoarthritis, right hip: Secondary | ICD-10-CM | POA: Diagnosis not present

## 2016-11-17 DIAGNOSIS — M1712 Unilateral primary osteoarthritis, left knee: Secondary | ICD-10-CM | POA: Diagnosis not present

## 2016-11-22 ENCOUNTER — Ambulatory Visit
Admission: RE | Admit: 2016-11-22 | Discharge: 2016-11-22 | Disposition: A | Discharge status: Home / Self Care | Payer: Commercial Managed Care - HMO | Source: Ambulatory Visit | Attending: Family Medicine | Admitting: Family Medicine

## 2016-11-22 DIAGNOSIS — Z1231 Encounter for screening mammogram for malignant neoplasm of breast: Secondary | ICD-10-CM | POA: Diagnosis not present

## 2016-12-01 DIAGNOSIS — M1611 Unilateral primary osteoarthritis, right hip: Secondary | ICD-10-CM | POA: Diagnosis not present

## 2016-12-15 DIAGNOSIS — M1611 Unilateral primary osteoarthritis, right hip: Secondary | ICD-10-CM | POA: Diagnosis not present

## 2017-03-28 DIAGNOSIS — E785 Hyperlipidemia, unspecified: Secondary | ICD-10-CM | POA: Diagnosis not present

## 2017-03-28 DIAGNOSIS — R7309 Other abnormal glucose: Secondary | ICD-10-CM | POA: Diagnosis not present

## 2017-03-28 DIAGNOSIS — I10 Essential (primary) hypertension: Secondary | ICD-10-CM | POA: Diagnosis not present

## 2017-03-28 DIAGNOSIS — M1712 Unilateral primary osteoarthritis, left knee: Secondary | ICD-10-CM | POA: Diagnosis not present

## 2017-10-26 DIAGNOSIS — I10 Essential (primary) hypertension: Secondary | ICD-10-CM | POA: Diagnosis not present

## 2017-10-26 DIAGNOSIS — M8580 Other specified disorders of bone density and structure, unspecified site: Secondary | ICD-10-CM | POA: Diagnosis not present

## 2017-10-26 DIAGNOSIS — R7309 Other abnormal glucose: Secondary | ICD-10-CM | POA: Diagnosis not present

## 2017-10-26 DIAGNOSIS — E785 Hyperlipidemia, unspecified: Secondary | ICD-10-CM | POA: Diagnosis not present

## 2017-10-26 DIAGNOSIS — Z8673 Personal history of transient ischemic attack (TIA), and cerebral infarction without residual deficits: Secondary | ICD-10-CM | POA: Diagnosis not present

## 2017-10-26 DIAGNOSIS — M1712 Unilateral primary osteoarthritis, left knee: Secondary | ICD-10-CM | POA: Diagnosis not present

## 2017-10-26 DIAGNOSIS — N763 Subacute and chronic vulvitis: Secondary | ICD-10-CM | POA: Diagnosis not present

## 2017-10-26 DIAGNOSIS — Z0000 Encounter for general adult medical examination without abnormal findings: Secondary | ICD-10-CM | POA: Diagnosis not present

## 2017-11-08 DIAGNOSIS — M1712 Unilateral primary osteoarthritis, left knee: Secondary | ICD-10-CM | POA: Diagnosis not present

## 2017-11-08 DIAGNOSIS — M170 Bilateral primary osteoarthritis of knee: Secondary | ICD-10-CM | POA: Diagnosis not present

## 2017-11-08 DIAGNOSIS — M1711 Unilateral primary osteoarthritis, right knee: Secondary | ICD-10-CM | POA: Diagnosis not present

## 2017-11-16 DIAGNOSIS — H5210 Myopia, unspecified eye: Secondary | ICD-10-CM | POA: Diagnosis not present

## 2017-11-16 DIAGNOSIS — E109 Type 1 diabetes mellitus without complications: Secondary | ICD-10-CM | POA: Diagnosis not present

## 2018-01-01 ENCOUNTER — Other Ambulatory Visit: Payer: Self-pay | Admitting: Family Medicine

## 2018-01-01 DIAGNOSIS — Z1231 Encounter for screening mammogram for malignant neoplasm of breast: Secondary | ICD-10-CM

## 2018-01-03 DIAGNOSIS — N644 Mastodynia: Secondary | ICD-10-CM | POA: Diagnosis not present

## 2018-01-30 ENCOUNTER — Ambulatory Visit
Admission: RE | Admit: 2018-01-30 | Discharge: 2018-01-30 | Disposition: A | Discharge status: Home / Self Care | Payer: Commercial Managed Care - HMO | Source: Ambulatory Visit | Attending: Family Medicine | Admitting: Family Medicine

## 2018-01-30 DIAGNOSIS — Z1231 Encounter for screening mammogram for malignant neoplasm of breast: Secondary | ICD-10-CM

## 2018-04-09 DIAGNOSIS — R05 Cough: Secondary | ICD-10-CM | POA: Diagnosis not present

## 2018-04-27 DIAGNOSIS — E785 Hyperlipidemia, unspecified: Secondary | ICD-10-CM | POA: Diagnosis not present

## 2018-04-27 DIAGNOSIS — N763 Subacute and chronic vulvitis: Secondary | ICD-10-CM | POA: Diagnosis not present

## 2018-04-27 DIAGNOSIS — D563 Thalassemia minor: Secondary | ICD-10-CM | POA: Diagnosis not present

## 2018-04-27 DIAGNOSIS — R05 Cough: Secondary | ICD-10-CM | POA: Diagnosis not present

## 2018-04-27 DIAGNOSIS — Z8673 Personal history of transient ischemic attack (TIA), and cerebral infarction without residual deficits: Secondary | ICD-10-CM | POA: Diagnosis not present

## 2018-04-27 DIAGNOSIS — Z23 Encounter for immunization: Secondary | ICD-10-CM | POA: Diagnosis not present

## 2018-04-27 DIAGNOSIS — I10 Essential (primary) hypertension: Secondary | ICD-10-CM | POA: Diagnosis not present

## 2018-04-27 DIAGNOSIS — R7309 Other abnormal glucose: Secondary | ICD-10-CM | POA: Diagnosis not present

## 2018-07-03 DIAGNOSIS — N762 Acute vulvitis: Secondary | ICD-10-CM | POA: Diagnosis not present

## 2018-07-03 DIAGNOSIS — Z01419 Encounter for gynecological examination (general) (routine) without abnormal findings: Secondary | ICD-10-CM | POA: Diagnosis not present

## 2018-07-03 DIAGNOSIS — L298 Other pruritus: Secondary | ICD-10-CM | POA: Diagnosis not present

## 2018-07-27 DIAGNOSIS — R1031 Right lower quadrant pain: Secondary | ICD-10-CM | POA: Diagnosis not present

## 2018-07-27 DIAGNOSIS — R05 Cough: Secondary | ICD-10-CM | POA: Diagnosis not present

## 2018-07-27 DIAGNOSIS — I10 Essential (primary) hypertension: Secondary | ICD-10-CM | POA: Diagnosis not present

## 2018-08-21 DIAGNOSIS — N762 Acute vulvitis: Secondary | ICD-10-CM | POA: Diagnosis not present

## 2018-12-21 ENCOUNTER — Other Ambulatory Visit: Payer: Self-pay | Admitting: Family Medicine

## 2018-12-21 DIAGNOSIS — Z1231 Encounter for screening mammogram for malignant neoplasm of breast: Secondary | ICD-10-CM

## 2018-12-26 DIAGNOSIS — I10 Essential (primary) hypertension: Secondary | ICD-10-CM | POA: Diagnosis not present

## 2018-12-26 DIAGNOSIS — E049 Nontoxic goiter, unspecified: Secondary | ICD-10-CM | POA: Diagnosis not present

## 2018-12-26 DIAGNOSIS — Z6841 Body Mass Index (BMI) 40.0 and over, adult: Secondary | ICD-10-CM | POA: Diagnosis not present

## 2018-12-26 DIAGNOSIS — D563 Thalassemia minor: Secondary | ICD-10-CM | POA: Diagnosis not present

## 2018-12-26 DIAGNOSIS — R7309 Other abnormal glucose: Secondary | ICD-10-CM | POA: Diagnosis not present

## 2018-12-26 DIAGNOSIS — Z8673 Personal history of transient ischemic attack (TIA), and cerebral infarction without residual deficits: Secondary | ICD-10-CM | POA: Diagnosis not present

## 2018-12-26 DIAGNOSIS — M8588 Other specified disorders of bone density and structure, other site: Secondary | ICD-10-CM | POA: Diagnosis not present

## 2018-12-26 DIAGNOSIS — Z0000 Encounter for general adult medical examination without abnormal findings: Secondary | ICD-10-CM | POA: Diagnosis not present

## 2018-12-26 DIAGNOSIS — E785 Hyperlipidemia, unspecified: Secondary | ICD-10-CM | POA: Diagnosis not present

## 2018-12-31 ENCOUNTER — Other Ambulatory Visit: Payer: Self-pay | Admitting: Family Medicine

## 2018-12-31 DIAGNOSIS — M8580 Other specified disorders of bone density and structure, unspecified site: Secondary | ICD-10-CM

## 2019-01-17 DIAGNOSIS — N762 Acute vulvitis: Secondary | ICD-10-CM | POA: Diagnosis not present

## 2019-01-17 DIAGNOSIS — K649 Unspecified hemorrhoids: Secondary | ICD-10-CM | POA: Diagnosis not present

## 2019-01-28 DIAGNOSIS — H524 Presbyopia: Secondary | ICD-10-CM | POA: Diagnosis not present

## 2019-02-20 ENCOUNTER — Ambulatory Visit: Payer: Medicare HMO

## 2019-03-27 ENCOUNTER — Ambulatory Visit
Admission: RE | Admit: 2019-03-27 | Discharge: 2019-03-27 | Disposition: A | Discharge status: Home / Self Care | Payer: Medicare HMO | Source: Ambulatory Visit | Attending: Family Medicine | Admitting: Family Medicine

## 2019-03-27 ENCOUNTER — Other Ambulatory Visit: Payer: Self-pay

## 2019-03-27 DIAGNOSIS — Z1231 Encounter for screening mammogram for malignant neoplasm of breast: Secondary | ICD-10-CM | POA: Diagnosis not present

## 2019-03-27 DIAGNOSIS — M8580 Other specified disorders of bone density and structure, unspecified site: Secondary | ICD-10-CM

## 2019-05-10 ENCOUNTER — Other Ambulatory Visit: Payer: Medicare HMO

## 2019-06-03 ENCOUNTER — Other Ambulatory Visit: Payer: Self-pay

## 2019-06-03 ENCOUNTER — Ambulatory Visit
Admission: RE | Admit: 2019-06-03 | Discharge: 2019-06-03 | Disposition: A | Discharge status: Home / Self Care | Payer: Medicare HMO | Source: Ambulatory Visit | Attending: Family Medicine | Admitting: Family Medicine

## 2019-06-03 DIAGNOSIS — M85852 Other specified disorders of bone density and structure, left thigh: Secondary | ICD-10-CM | POA: Diagnosis not present

## 2019-06-03 DIAGNOSIS — Z780 Asymptomatic menopausal state: Secondary | ICD-10-CM | POA: Diagnosis not present

## 2019-06-28 DIAGNOSIS — R202 Paresthesia of skin: Secondary | ICD-10-CM | POA: Diagnosis not present

## 2019-06-28 DIAGNOSIS — Z23 Encounter for immunization: Secondary | ICD-10-CM | POA: Diagnosis not present

## 2019-06-28 DIAGNOSIS — Z8673 Personal history of transient ischemic attack (TIA), and cerebral infarction without residual deficits: Secondary | ICD-10-CM | POA: Diagnosis not present

## 2019-06-28 DIAGNOSIS — E785 Hyperlipidemia, unspecified: Secondary | ICD-10-CM | POA: Diagnosis not present

## 2019-06-28 DIAGNOSIS — I10 Essential (primary) hypertension: Secondary | ICD-10-CM | POA: Diagnosis not present

## 2019-06-28 DIAGNOSIS — Z6841 Body Mass Index (BMI) 40.0 and over, adult: Secondary | ICD-10-CM | POA: Diagnosis not present

## 2019-06-28 DIAGNOSIS — R7309 Other abnormal glucose: Secondary | ICD-10-CM | POA: Diagnosis not present

## 2019-07-02 DIAGNOSIS — N762 Acute vulvitis: Secondary | ICD-10-CM | POA: Diagnosis not present

## 2019-07-02 DIAGNOSIS — K649 Unspecified hemorrhoids: Secondary | ICD-10-CM | POA: Diagnosis not present

## 2019-08-13 DIAGNOSIS — G5601 Carpal tunnel syndrome, right upper limb: Secondary | ICD-10-CM | POA: Diagnosis not present

## 2019-08-13 DIAGNOSIS — Z7189 Other specified counseling: Secondary | ICD-10-CM | POA: Diagnosis not present

## 2019-09-06 DIAGNOSIS — G5601 Carpal tunnel syndrome, right upper limb: Secondary | ICD-10-CM | POA: Diagnosis not present

## 2019-09-16 DIAGNOSIS — D563 Thalassemia minor: Secondary | ICD-10-CM | POA: Diagnosis not present

## 2019-09-16 DIAGNOSIS — M1712 Unilateral primary osteoarthritis, left knee: Secondary | ICD-10-CM | POA: Diagnosis not present

## 2019-09-16 DIAGNOSIS — I10 Essential (primary) hypertension: Secondary | ICD-10-CM | POA: Diagnosis not present

## 2019-09-16 DIAGNOSIS — E785 Hyperlipidemia, unspecified: Secondary | ICD-10-CM | POA: Diagnosis not present

## 2019-09-30 DIAGNOSIS — D563 Thalassemia minor: Secondary | ICD-10-CM | POA: Diagnosis not present

## 2019-09-30 DIAGNOSIS — M1712 Unilateral primary osteoarthritis, left knee: Secondary | ICD-10-CM | POA: Diagnosis not present

## 2019-09-30 DIAGNOSIS — I10 Essential (primary) hypertension: Secondary | ICD-10-CM | POA: Diagnosis not present

## 2019-09-30 DIAGNOSIS — E785 Hyperlipidemia, unspecified: Secondary | ICD-10-CM | POA: Diagnosis not present

## 2019-10-18 DIAGNOSIS — G5601 Carpal tunnel syndrome, right upper limb: Secondary | ICD-10-CM | POA: Diagnosis not present

## 2019-10-21 DIAGNOSIS — R151 Fecal smearing: Secondary | ICD-10-CM | POA: Diagnosis not present

## 2019-10-21 DIAGNOSIS — K649 Unspecified hemorrhoids: Secondary | ICD-10-CM | POA: Diagnosis not present

## 2019-11-08 DIAGNOSIS — Z1211 Encounter for screening for malignant neoplasm of colon: Secondary | ICD-10-CM | POA: Diagnosis not present

## 2019-11-08 DIAGNOSIS — Z6841 Body Mass Index (BMI) 40.0 and over, adult: Secondary | ICD-10-CM | POA: Diagnosis not present

## 2019-11-08 DIAGNOSIS — K648 Other hemorrhoids: Secondary | ICD-10-CM | POA: Diagnosis not present

## 2019-11-08 DIAGNOSIS — R195 Other fecal abnormalities: Secondary | ICD-10-CM | POA: Diagnosis not present

## 2019-11-08 DIAGNOSIS — R159 Full incontinence of feces: Secondary | ICD-10-CM | POA: Diagnosis not present

## 2019-12-25 DIAGNOSIS — M1712 Unilateral primary osteoarthritis, left knee: Secondary | ICD-10-CM | POA: Diagnosis not present

## 2019-12-25 DIAGNOSIS — E785 Hyperlipidemia, unspecified: Secondary | ICD-10-CM | POA: Diagnosis not present

## 2019-12-25 DIAGNOSIS — I10 Essential (primary) hypertension: Secondary | ICD-10-CM | POA: Diagnosis not present

## 2019-12-25 DIAGNOSIS — D563 Thalassemia minor: Secondary | ICD-10-CM | POA: Diagnosis not present

## 2019-12-30 DIAGNOSIS — I10 Essential (primary) hypertension: Secondary | ICD-10-CM | POA: Diagnosis not present

## 2019-12-30 DIAGNOSIS — Z1389 Encounter for screening for other disorder: Secondary | ICD-10-CM | POA: Diagnosis not present

## 2019-12-30 DIAGNOSIS — Z0000 Encounter for general adult medical examination without abnormal findings: Secondary | ICD-10-CM | POA: Diagnosis not present

## 2019-12-30 DIAGNOSIS — D563 Thalassemia minor: Secondary | ICD-10-CM | POA: Diagnosis not present

## 2019-12-30 DIAGNOSIS — Z8673 Personal history of transient ischemic attack (TIA), and cerebral infarction without residual deficits: Secondary | ICD-10-CM | POA: Diagnosis not present

## 2019-12-30 DIAGNOSIS — E785 Hyperlipidemia, unspecified: Secondary | ICD-10-CM | POA: Diagnosis not present

## 2019-12-30 DIAGNOSIS — R7303 Prediabetes: Secondary | ICD-10-CM | POA: Diagnosis not present

## 2019-12-31 DIAGNOSIS — N762 Acute vulvitis: Secondary | ICD-10-CM | POA: Diagnosis not present

## 2020-02-03 DIAGNOSIS — Z135 Encounter for screening for eye and ear disorders: Secondary | ICD-10-CM | POA: Diagnosis not present

## 2020-02-03 DIAGNOSIS — Z0100 Encounter for examination of eyes and vision without abnormal findings: Secondary | ICD-10-CM | POA: Diagnosis not present

## 2020-02-03 DIAGNOSIS — H524 Presbyopia: Secondary | ICD-10-CM | POA: Diagnosis not present

## 2020-04-29 DIAGNOSIS — M1712 Unilateral primary osteoarthritis, left knee: Secondary | ICD-10-CM | POA: Diagnosis not present

## 2020-04-29 DIAGNOSIS — M25561 Pain in right knee: Secondary | ICD-10-CM | POA: Diagnosis not present

## 2020-05-04 ENCOUNTER — Other Ambulatory Visit: Payer: Self-pay | Admitting: Family Medicine

## 2020-05-04 DIAGNOSIS — Z1231 Encounter for screening mammogram for malignant neoplasm of breast: Secondary | ICD-10-CM

## 2020-05-07 ENCOUNTER — Ambulatory Visit
Admission: RE | Admit: 2020-05-07 | Discharge: 2020-05-07 | Disposition: A | Discharge status: Home / Self Care | Payer: Medicare HMO | Source: Ambulatory Visit | Attending: Family Medicine | Admitting: Family Medicine

## 2020-05-07 ENCOUNTER — Other Ambulatory Visit: Payer: Self-pay

## 2020-05-07 DIAGNOSIS — Z1231 Encounter for screening mammogram for malignant neoplasm of breast: Secondary | ICD-10-CM

## 2020-05-27 DIAGNOSIS — T63424A Toxic effect of venom of ants, undetermined, initial encounter: Secondary | ICD-10-CM | POA: Diagnosis not present

## 2020-06-16 DIAGNOSIS — D563 Thalassemia minor: Secondary | ICD-10-CM | POA: Diagnosis not present

## 2020-06-16 DIAGNOSIS — I10 Essential (primary) hypertension: Secondary | ICD-10-CM | POA: Diagnosis not present

## 2020-06-16 DIAGNOSIS — E785 Hyperlipidemia, unspecified: Secondary | ICD-10-CM | POA: Diagnosis not present

## 2020-06-16 DIAGNOSIS — M1712 Unilateral primary osteoarthritis, left knee: Secondary | ICD-10-CM | POA: Diagnosis not present

## 2020-07-02 DIAGNOSIS — I10 Essential (primary) hypertension: Secondary | ICD-10-CM | POA: Diagnosis not present

## 2020-07-02 DIAGNOSIS — Z8673 Personal history of transient ischemic attack (TIA), and cerebral infarction without residual deficits: Secondary | ICD-10-CM | POA: Diagnosis not present

## 2020-07-02 DIAGNOSIS — R7309 Other abnormal glucose: Secondary | ICD-10-CM | POA: Diagnosis not present

## 2020-07-02 DIAGNOSIS — E785 Hyperlipidemia, unspecified: Secondary | ICD-10-CM | POA: Diagnosis not present

## 2020-07-10 DIAGNOSIS — N762 Acute vulvitis: Secondary | ICD-10-CM | POA: Diagnosis not present

## 2020-07-10 DIAGNOSIS — K649 Unspecified hemorrhoids: Secondary | ICD-10-CM | POA: Diagnosis not present

## 2020-07-29 DIAGNOSIS — I10 Essential (primary) hypertension: Secondary | ICD-10-CM | POA: Diagnosis not present

## 2020-07-29 DIAGNOSIS — M1712 Unilateral primary osteoarthritis, left knee: Secondary | ICD-10-CM | POA: Diagnosis not present

## 2020-07-29 DIAGNOSIS — E785 Hyperlipidemia, unspecified: Secondary | ICD-10-CM | POA: Diagnosis not present

## 2020-07-29 DIAGNOSIS — M8580 Other specified disorders of bone density and structure, unspecified site: Secondary | ICD-10-CM | POA: Diagnosis not present

## 2020-07-29 DIAGNOSIS — D563 Thalassemia minor: Secondary | ICD-10-CM | POA: Diagnosis not present

## 2020-09-16 DIAGNOSIS — E785 Hyperlipidemia, unspecified: Secondary | ICD-10-CM | POA: Diagnosis not present

## 2020-09-16 DIAGNOSIS — M1712 Unilateral primary osteoarthritis, left knee: Secondary | ICD-10-CM | POA: Diagnosis not present

## 2020-09-16 DIAGNOSIS — M8580 Other specified disorders of bone density and structure, unspecified site: Secondary | ICD-10-CM | POA: Diagnosis not present

## 2020-09-16 DIAGNOSIS — I10 Essential (primary) hypertension: Secondary | ICD-10-CM | POA: Diagnosis not present

## 2020-09-16 DIAGNOSIS — D563 Thalassemia minor: Secondary | ICD-10-CM | POA: Diagnosis not present

## 2020-12-09 DIAGNOSIS — D563 Thalassemia minor: Secondary | ICD-10-CM | POA: Diagnosis not present

## 2020-12-09 DIAGNOSIS — M1712 Unilateral primary osteoarthritis, left knee: Secondary | ICD-10-CM | POA: Diagnosis not present

## 2020-12-09 DIAGNOSIS — E785 Hyperlipidemia, unspecified: Secondary | ICD-10-CM | POA: Diagnosis not present

## 2020-12-09 DIAGNOSIS — I10 Essential (primary) hypertension: Secondary | ICD-10-CM | POA: Diagnosis not present

## 2021-02-04 DIAGNOSIS — D563 Thalassemia minor: Secondary | ICD-10-CM | POA: Diagnosis not present

## 2021-02-04 DIAGNOSIS — I10 Essential (primary) hypertension: Secondary | ICD-10-CM | POA: Diagnosis not present

## 2021-02-04 DIAGNOSIS — M1712 Unilateral primary osteoarthritis, left knee: Secondary | ICD-10-CM | POA: Diagnosis not present

## 2021-02-04 DIAGNOSIS — E785 Hyperlipidemia, unspecified: Secondary | ICD-10-CM | POA: Diagnosis not present

## 2021-03-12 DIAGNOSIS — R7303 Prediabetes: Secondary | ICD-10-CM | POA: Diagnosis not present

## 2021-03-12 DIAGNOSIS — Z0000 Encounter for general adult medical examination without abnormal findings: Secondary | ICD-10-CM | POA: Diagnosis not present

## 2021-03-12 DIAGNOSIS — I10 Essential (primary) hypertension: Secondary | ICD-10-CM | POA: Diagnosis not present

## 2021-03-12 DIAGNOSIS — M8588 Other specified disorders of bone density and structure, other site: Secondary | ICD-10-CM | POA: Diagnosis not present

## 2021-03-12 DIAGNOSIS — N762 Acute vulvitis: Secondary | ICD-10-CM | POA: Diagnosis not present

## 2021-03-12 DIAGNOSIS — Z8673 Personal history of transient ischemic attack (TIA), and cerebral infarction without residual deficits: Secondary | ICD-10-CM | POA: Diagnosis not present

## 2021-03-12 DIAGNOSIS — E785 Hyperlipidemia, unspecified: Secondary | ICD-10-CM | POA: Diagnosis not present

## 2021-04-27 DIAGNOSIS — L089 Local infection of the skin and subcutaneous tissue, unspecified: Secondary | ICD-10-CM | POA: Diagnosis not present

## 2021-05-04 DIAGNOSIS — H524 Presbyopia: Secondary | ICD-10-CM | POA: Diagnosis not present

## 2021-05-04 DIAGNOSIS — I10 Essential (primary) hypertension: Secondary | ICD-10-CM | POA: Diagnosis not present

## 2021-05-04 DIAGNOSIS — Z0100 Encounter for examination of eyes and vision without abnormal findings: Secondary | ICD-10-CM | POA: Diagnosis not present

## 2021-05-04 DIAGNOSIS — E7800 Pure hypercholesterolemia, unspecified: Secondary | ICD-10-CM | POA: Diagnosis not present

## 2021-06-14 ENCOUNTER — Other Ambulatory Visit: Payer: Self-pay | Admitting: Family Medicine

## 2021-06-14 DIAGNOSIS — Z1231 Encounter for screening mammogram for malignant neoplasm of breast: Secondary | ICD-10-CM

## 2021-06-16 ENCOUNTER — Other Ambulatory Visit: Payer: Self-pay

## 2021-06-16 ENCOUNTER — Ambulatory Visit
Admission: RE | Admit: 2021-06-16 | Discharge: 2021-06-16 | Disposition: A | Discharge status: Home / Self Care | Payer: Medicare HMO | Source: Ambulatory Visit | Attending: Family Medicine | Admitting: Family Medicine

## 2021-06-16 DIAGNOSIS — Z1231 Encounter for screening mammogram for malignant neoplasm of breast: Secondary | ICD-10-CM | POA: Diagnosis not present

## 2021-07-05 DIAGNOSIS — N762 Acute vulvitis: Secondary | ICD-10-CM | POA: Diagnosis not present

## 2021-07-05 DIAGNOSIS — K649 Unspecified hemorrhoids: Secondary | ICD-10-CM | POA: Diagnosis not present

## 2021-09-16 DIAGNOSIS — E785 Hyperlipidemia, unspecified: Secondary | ICD-10-CM | POA: Diagnosis not present

## 2021-09-16 DIAGNOSIS — R7303 Prediabetes: Secondary | ICD-10-CM | POA: Diagnosis not present

## 2021-09-16 DIAGNOSIS — L03012 Cellulitis of left finger: Secondary | ICD-10-CM | POA: Diagnosis not present

## 2021-09-16 DIAGNOSIS — I10 Essential (primary) hypertension: Secondary | ICD-10-CM | POA: Diagnosis not present

## 2021-09-16 DIAGNOSIS — Z8673 Personal history of transient ischemic attack (TIA), and cerebral infarction without residual deficits: Secondary | ICD-10-CM | POA: Diagnosis not present

## 2021-12-01 DIAGNOSIS — M1712 Unilateral primary osteoarthritis, left knee: Secondary | ICD-10-CM | POA: Diagnosis not present

## 2022-01-27 DIAGNOSIS — M1712 Unilateral primary osteoarthritis, left knee: Secondary | ICD-10-CM | POA: Diagnosis not present

## 2022-01-27 DIAGNOSIS — M25551 Pain in right hip: Secondary | ICD-10-CM | POA: Diagnosis not present

## 2022-05-02 DIAGNOSIS — M8588 Other specified disorders of bone density and structure, other site: Secondary | ICD-10-CM | POA: Diagnosis not present

## 2022-05-02 DIAGNOSIS — D563 Thalassemia minor: Secondary | ICD-10-CM | POA: Diagnosis not present

## 2022-05-02 DIAGNOSIS — R7303 Prediabetes: Secondary | ICD-10-CM | POA: Diagnosis not present

## 2022-05-02 DIAGNOSIS — Z8673 Personal history of transient ischemic attack (TIA), and cerebral infarction without residual deficits: Secondary | ICD-10-CM | POA: Diagnosis not present

## 2022-05-02 DIAGNOSIS — E785 Hyperlipidemia, unspecified: Secondary | ICD-10-CM | POA: Diagnosis not present

## 2022-05-02 DIAGNOSIS — R609 Edema, unspecified: Secondary | ICD-10-CM | POA: Diagnosis not present

## 2022-05-02 DIAGNOSIS — Z0000 Encounter for general adult medical examination without abnormal findings: Secondary | ICD-10-CM | POA: Diagnosis not present

## 2022-05-02 DIAGNOSIS — I10 Essential (primary) hypertension: Secondary | ICD-10-CM | POA: Diagnosis not present

## 2022-05-03 ENCOUNTER — Other Ambulatory Visit: Payer: Self-pay | Admitting: Family Medicine

## 2022-05-03 DIAGNOSIS — M8580 Other specified disorders of bone density and structure, unspecified site: Secondary | ICD-10-CM

## 2022-05-17 ENCOUNTER — Other Ambulatory Visit: Payer: Self-pay | Admitting: Family Medicine

## 2022-05-17 DIAGNOSIS — Z1231 Encounter for screening mammogram for malignant neoplasm of breast: Secondary | ICD-10-CM

## 2022-06-15 DIAGNOSIS — Z135 Encounter for screening for eye and ear disorders: Secondary | ICD-10-CM | POA: Diagnosis not present

## 2022-06-15 DIAGNOSIS — H524 Presbyopia: Secondary | ICD-10-CM | POA: Diagnosis not present

## 2022-06-15 DIAGNOSIS — H26493 Other secondary cataract, bilateral: Secondary | ICD-10-CM | POA: Diagnosis not present

## 2022-06-20 DIAGNOSIS — M13862 Other specified arthritis, left knee: Secondary | ICD-10-CM | POA: Diagnosis not present

## 2022-07-08 DIAGNOSIS — N763 Subacute and chronic vulvitis: Secondary | ICD-10-CM | POA: Diagnosis not present

## 2022-07-08 DIAGNOSIS — Z01419 Encounter for gynecological examination (general) (routine) without abnormal findings: Secondary | ICD-10-CM | POA: Diagnosis not present

## 2022-09-02 DIAGNOSIS — M1611 Unilateral primary osteoarthritis, right hip: Secondary | ICD-10-CM | POA: Diagnosis not present

## 2022-09-02 DIAGNOSIS — M1712 Unilateral primary osteoarthritis, left knee: Secondary | ICD-10-CM | POA: Diagnosis not present

## 2022-09-07 DIAGNOSIS — M25551 Pain in right hip: Secondary | ICD-10-CM | POA: Diagnosis not present

## 2022-11-03 ENCOUNTER — Encounter: Payer: Self-pay | Admitting: Family Medicine

## 2022-11-03 DIAGNOSIS — I10 Essential (primary) hypertension: Secondary | ICD-10-CM | POA: Diagnosis not present

## 2022-11-03 DIAGNOSIS — E785 Hyperlipidemia, unspecified: Secondary | ICD-10-CM | POA: Diagnosis not present

## 2022-11-03 DIAGNOSIS — F4321 Adjustment disorder with depressed mood: Secondary | ICD-10-CM | POA: Diagnosis not present

## 2022-11-03 DIAGNOSIS — R053 Chronic cough: Secondary | ICD-10-CM | POA: Diagnosis not present

## 2022-11-03 DIAGNOSIS — Z8673 Personal history of transient ischemic attack (TIA), and cerebral infarction without residual deficits: Secondary | ICD-10-CM | POA: Diagnosis not present

## 2022-11-09 ENCOUNTER — Ambulatory Visit
Admission: RE | Admit: 2022-11-09 | Discharge: 2022-11-09 | Disposition: A | Discharge status: Home / Self Care | Payer: Medicare PPO | Source: Ambulatory Visit | Attending: Family Medicine | Admitting: Family Medicine

## 2022-11-09 DIAGNOSIS — Z780 Asymptomatic menopausal state: Secondary | ICD-10-CM | POA: Diagnosis not present

## 2022-11-09 DIAGNOSIS — M85852 Other specified disorders of bone density and structure, left thigh: Secondary | ICD-10-CM | POA: Diagnosis not present

## 2022-11-09 DIAGNOSIS — Z1231 Encounter for screening mammogram for malignant neoplasm of breast: Secondary | ICD-10-CM

## 2022-11-09 DIAGNOSIS — M8580 Other specified disorders of bone density and structure, unspecified site: Secondary | ICD-10-CM

## 2022-11-22 ENCOUNTER — Other Ambulatory Visit: Payer: Self-pay | Admitting: Family Medicine

## 2022-11-22 ENCOUNTER — Ambulatory Visit
Admission: RE | Admit: 2022-11-22 | Discharge: 2022-11-22 | Disposition: A | Discharge status: Home / Self Care | Payer: Medicare PPO | Source: Ambulatory Visit | Attending: Family Medicine | Admitting: Family Medicine

## 2022-11-22 DIAGNOSIS — R053 Chronic cough: Secondary | ICD-10-CM

## 2022-12-26 DIAGNOSIS — M1712 Unilateral primary osteoarthritis, left knee: Secondary | ICD-10-CM | POA: Diagnosis not present

## 2022-12-26 DIAGNOSIS — M1611 Unilateral primary osteoarthritis, right hip: Secondary | ICD-10-CM | POA: Diagnosis not present

## 2023-02-15 DIAGNOSIS — M1611 Unilateral primary osteoarthritis, right hip: Secondary | ICD-10-CM | POA: Diagnosis not present

## 2023-05-08 DIAGNOSIS — R7303 Prediabetes: Secondary | ICD-10-CM | POA: Diagnosis not present

## 2023-05-08 DIAGNOSIS — D563 Thalassemia minor: Secondary | ICD-10-CM | POA: Diagnosis not present

## 2023-05-08 DIAGNOSIS — I10 Essential (primary) hypertension: Secondary | ICD-10-CM | POA: Diagnosis not present

## 2023-05-08 DIAGNOSIS — E049 Nontoxic goiter, unspecified: Secondary | ICD-10-CM | POA: Diagnosis not present

## 2023-05-08 DIAGNOSIS — Z8673 Personal history of transient ischemic attack (TIA), and cerebral infarction without residual deficits: Secondary | ICD-10-CM | POA: Diagnosis not present

## 2023-05-08 DIAGNOSIS — Z0000 Encounter for general adult medical examination without abnormal findings: Secondary | ICD-10-CM | POA: Diagnosis not present

## 2023-05-08 DIAGNOSIS — M8588 Other specified disorders of bone density and structure, other site: Secondary | ICD-10-CM | POA: Diagnosis not present

## 2023-05-08 DIAGNOSIS — E785 Hyperlipidemia, unspecified: Secondary | ICD-10-CM | POA: Diagnosis not present

## 2023-07-04 DIAGNOSIS — M1712 Unilateral primary osteoarthritis, left knee: Secondary | ICD-10-CM | POA: Diagnosis not present

## 2023-07-13 DIAGNOSIS — N763 Subacute and chronic vulvitis: Secondary | ICD-10-CM | POA: Diagnosis not present

## 2023-07-13 DIAGNOSIS — R159 Full incontinence of feces: Secondary | ICD-10-CM | POA: Diagnosis not present

## 2023-07-13 DIAGNOSIS — N3281 Overactive bladder: Secondary | ICD-10-CM | POA: Diagnosis not present

## 2023-10-02 ENCOUNTER — Other Ambulatory Visit: Payer: Self-pay | Admitting: Family Medicine

## 2023-10-02 DIAGNOSIS — Z0000 Encounter for general adult medical examination without abnormal findings: Secondary | ICD-10-CM

## 2023-10-03 DIAGNOSIS — M1611 Unilateral primary osteoarthritis, right hip: Secondary | ICD-10-CM | POA: Diagnosis not present

## 2023-10-11 DIAGNOSIS — M1611 Unilateral primary osteoarthritis, right hip: Secondary | ICD-10-CM | POA: Diagnosis not present

## 2023-10-11 DIAGNOSIS — M1712 Unilateral primary osteoarthritis, left knee: Secondary | ICD-10-CM | POA: Diagnosis not present

## 2023-11-06 DIAGNOSIS — E785 Hyperlipidemia, unspecified: Secondary | ICD-10-CM | POA: Diagnosis not present

## 2023-11-06 DIAGNOSIS — R7303 Prediabetes: Secondary | ICD-10-CM | POA: Diagnosis not present

## 2023-11-06 DIAGNOSIS — R490 Dysphonia: Secondary | ICD-10-CM | POA: Diagnosis not present

## 2023-11-06 DIAGNOSIS — I10 Essential (primary) hypertension: Secondary | ICD-10-CM | POA: Diagnosis not present

## 2023-11-06 DIAGNOSIS — R1084 Generalized abdominal pain: Secondary | ICD-10-CM | POA: Diagnosis not present

## 2023-11-10 ENCOUNTER — Ambulatory Visit
Admission: RE | Admit: 2023-11-10 | Discharge: 2023-11-10 | Disposition: A | Discharge status: Home / Self Care | Payer: Medicare PPO | Source: Ambulatory Visit | Attending: Family Medicine | Admitting: Family Medicine

## 2023-11-10 DIAGNOSIS — Z1231 Encounter for screening mammogram for malignant neoplasm of breast: Secondary | ICD-10-CM | POA: Diagnosis not present

## 2023-11-10 DIAGNOSIS — Z0000 Encounter for general adult medical examination without abnormal findings: Secondary | ICD-10-CM

## 2023-12-06 ENCOUNTER — Emergency Department (HOSPITAL_BASED_OUTPATIENT_CLINIC_OR_DEPARTMENT_OTHER)
Admission: EM | Admit: 2023-12-06 | Discharge: 2023-12-06 | Disposition: A | Discharge status: Home / Self Care | Attending: Emergency Medicine | Admitting: Emergency Medicine

## 2023-12-06 ENCOUNTER — Other Ambulatory Visit: Payer: Self-pay

## 2023-12-06 ENCOUNTER — Emergency Department (HOSPITAL_BASED_OUTPATIENT_CLINIC_OR_DEPARTMENT_OTHER)

## 2023-12-06 ENCOUNTER — Encounter (HOSPITAL_BASED_OUTPATIENT_CLINIC_OR_DEPARTMENT_OTHER): Payer: Self-pay | Admitting: Emergency Medicine

## 2023-12-06 DIAGNOSIS — M25562 Pain in left knee: Secondary | ICD-10-CM | POA: Diagnosis not present

## 2023-12-06 DIAGNOSIS — Z7902 Long term (current) use of antithrombotics/antiplatelets: Secondary | ICD-10-CM | POA: Diagnosis not present

## 2023-12-06 DIAGNOSIS — M25551 Pain in right hip: Secondary | ICD-10-CM | POA: Insufficient documentation

## 2023-12-06 DIAGNOSIS — M1712 Unilateral primary osteoarthritis, left knee: Secondary | ICD-10-CM | POA: Insufficient documentation

## 2023-12-06 MED ORDER — DICLOFENAC SODIUM 1 % EX GEL: 2.0000 g | Freq: Four times a day (QID) | CUTANEOUS | 0 refills | Status: AC

## 2023-12-06 NOTE — ED Provider Notes (Signed)
 [facility] Provider Note   CSN: [id number] Arrival date & time: 12/06/23  7829     History  Chief Complaint  Patient presents with   Leg Pain    bilateral    Stacy Levy is a 89 y.o. female with history of knee osteoarthritis, presents with concern for left knee pain that started about 1 week ago when trying to step into a van.  She states that her left knee "gave out on her", and she was lowered to the ground by her son.  She denies any numbness or tingling in her feet.  Also reporting right hip pain that has been ongoing for about 4 years, no acute worsening.   Leg Pain      Home Medications Prior to Admission medications   Medication Sig Start Date End Date Taking? Authorizing Provider  diclofenac Sodium (VOLTAREN) 1 % GEL Apply 2 g topically 4 (four) times daily for 14 days. 12/06/23 12/20/23 Yes Anders Simmonds T, DO  atorvastatin (LIPITOR) 40 MG tablet Take 1 tablet (40 mg total) by mouth daily at 6 PM. 12/10/15   Rama, Maryruth Bun, MD  clopidogrel (PLAVIX) 75 MG tablet Take 1 tablet (75 mg total) by mouth daily. 12/10/15   Rama, Maryruth Bun, MD  guaiFENesin (ROBITUSSIN) 100 MG/5ML liquid Take 200 mg by mouth 3 (three) times daily as needed for cough or congestion.    [provider]  Multiple Vitamin (THERA) TABS Take 1 tablet by mouth daily.    [provider]  valsartan-hydrochlorothiazide (DIOVAN-HCT) 320-25 MG tablet Take 1 tablet by mouth daily. 11/12/15   [provider]      Allergies    Aspirin    Review of Systems   Review of Systems  Musculoskeletal:        Left knee pain    Physical Exam Updated Vital Signs BP 135/67 (BP Location: Right Arm)   Pulse 67   Temp 98 F (36.7 C) (Oral)   Resp 18   Wt 98 kg   SpO2 96%   BMI 37.08 kg/m  Physical Exam Vitals and nursing note reviewed.  Constitutional:      Appearance: Normal appearance.  HENT:     Head: Atraumatic.   Cardiovascular:     Comments: 2+ dorsalis pedis pulse bilaterally Pulmonary:     Effort: Pulmonary effort is normal.  Musculoskeletal:     Comments: Left lower extremity:  General No obvious deformity. No erythema, edema, contusions, open wounds   Palpation Tender to palpation of the medial and lateral joint joint lines of the knee.   Non tender over the femur Nontender along the tibia and fibula, patella, MCL, LCL Nontender on the lateral and medial malleolus Non-tender of the popliteal fossa  ROM Full knee flexion and extension bilaterally, able to flex at the hips bilaterally.  Plantar flexes and dorsiflexes at the ankles bilaterally without difficulty.  Special tests No ligamentous laxity with Lachman's, posterior drawer, valgus or varus stress the knee  Sensation: Sensation intact throughout the lower extremity  Strength: 5/5 strength with resisted knee flexion and extension  5/5 strength with resisted ankle plantarflexion and dorsiflexion   Tender to palpation over the right greater trochanter.   Neurological:     General: No focal deficit present.     Mental Status: She is alert.  Psychiatric:        Mood and Affect: Mood normal.        Behavior: Behavior  normal.     ED Results / Procedures / Treatments   Labs (all labs ordered are listed, but only abnormal results are displayed) Labs Reviewed - No data to display  EKG None  Radiology DG Knee Complete 4 Views Left Result Date: 12/06/2023 CLINICAL DATA:  pain EXAM: LEFT KNEE - COMPLETE 4+ VIEW COMPARISON:  None Available. FINDINGS: No acute fracture or dislocation. No aggressive osseous lesion. There are degenerative changes of the knee joint in the form of moderately reduced tibiofemoral compartment joint space, asymmetrically involving medial compartment along with tibial spiking and tricompartmental osteophytosis. No knee effusion or focal soft tissue swelling. No radiopaque foreign bodies. IMPRESSION:  1. No acute osseous abnormality of the left knee joint. 2. Moderate degenerative changes of the left knee joint. Electronically Signed   By: Jules Schick M.D.   On: 12/06/2023 10:43    Procedures Procedures    Medications Ordered in ED Medications - No data to display  ED Course/ Medical Decision Making/ A&P                                 Medical Decision Making Amount and/or Complexity of Data Reviewed Radiology: ordered.     Differential diagnosis includes but is not limited to fracture, dislocation, sprain, ligamentous injury, meniscal injury, osteoarthritis  ED Course:  Upon initial evaluation, patient is well-appearing, stable vital signs.  Reporting pain in her left knee for the past week.  She is tender along the medial and lateral joint lines, and x-ray showing arthritic changes in the left knee.  No fractures or dislocations appreciated on x-ray.  I do not appreciate any ligamentous laxity of the knee on exam.  Neurovascular intact in the bilateral lower extremities.  Suspect her pain is secondary to osteoarthritis flare.  Stable and appropriate for discharge home. My attending Dr. Andria Meuse also personally evaluated patient and agrees with plan of care   Imaging Studies ordered: I ordered imaging studies including x-ray left knee I independently visualized the imaging with scope of interpretation limited to determining acute life threatening conditions related to emergency care. Imaging showed joint space narrowing, greatest in the medial aspect of the knee. I agree with the radiologist interpretation  Impression: Left knee osteoarthritis  Disposition:  The patient was discharged home with instructions to take Tylenol as needed for pain.  May also apply topical diclofenac gel to the knee to help with pain.  Follow-up with her orthopedic provider Return precautions given.    Record Review: External records from outside source obtained and reviewed including  previous visits for osteoarthritis     This chart was dictated using voice recognition software, Dragon. Despite the best efforts of this provider to proofread and correct errors, errors may still occur which can change documentation meaning.         Final Clinical Impression(s) / ED Diagnoses Final diagnoses:  Osteoarthritis of left knee, unspecified osteoarthritis type    Rx / DC Orders ED Discharge Orders          Ordered    diclofenac Sodium (VOLTAREN) 1 % GEL  4 times daily        12/06/23 1106              Arabella Merles, PA-C 12/06/23 1107    Anders Simmonds T, DO 12/07/23 [phone]

## 2023-12-06 NOTE — ED Triage Notes (Signed)
 Recurrent bilateral legs pain , left knee "gave up on her " trying to get in her [employer] . No fall . Also reports right hip pain . Hx arthritis .  Pt wheeled to triage due to her pain .

## 2023-12-06 NOTE — Discharge Instructions (Addendum)
 The pain in your left knee is likely due to an arthritis flare.  The x-rays today showed degenerative changes consistent with arthritis, but no fractures or dislocations.  You may take up to 1000mg  of tylenol every 6 hours as needed for pain.  Do not take more then 4g per day.  You may also use Voltaren gel (diclofenac) on your knee to help with pain.  We have sent this prescription into your pharmacy.  This is also an over-the-counter medication if you find this is helpful need further refills.  Please make an appointment with your orthopedic provider for follow-up as soon as possible.  I will be able to discuss further management with you.  You may also benefit from physical therapy which they can refer you for.

## 2023-12-15 DIAGNOSIS — M1611 Unilateral primary osteoarthritis, right hip: Secondary | ICD-10-CM | POA: Diagnosis not present

## 2023-12-15 DIAGNOSIS — M1712 Unilateral primary osteoarthritis, left knee: Secondary | ICD-10-CM | POA: Diagnosis not present

## 2024-01-04 DIAGNOSIS — M1712 Unilateral primary osteoarthritis, left knee: Secondary | ICD-10-CM | POA: Diagnosis not present

## 2024-01-10 DIAGNOSIS — M1611 Unilateral primary osteoarthritis, right hip: Secondary | ICD-10-CM | POA: Diagnosis not present

## 2024-01-10 DIAGNOSIS — M25551 Pain in right hip: Secondary | ICD-10-CM | POA: Diagnosis not present

## 2024-02-22 DIAGNOSIS — I10 Essential (primary) hypertension: Secondary | ICD-10-CM | POA: Diagnosis not present

## 2024-03-21 DIAGNOSIS — E785 Hyperlipidemia, unspecified: Secondary | ICD-10-CM | POA: Diagnosis not present

## 2024-03-21 DIAGNOSIS — F4321 Adjustment disorder with depressed mood: Secondary | ICD-10-CM | POA: Diagnosis not present

## 2024-03-21 DIAGNOSIS — I10 Essential (primary) hypertension: Secondary | ICD-10-CM | POA: Diagnosis not present

## 2024-03-25 DIAGNOSIS — Z01818 Encounter for other preprocedural examination: Secondary | ICD-10-CM | POA: Diagnosis not present

## 2024-03-25 DIAGNOSIS — M1611 Unilateral primary osteoarthritis, right hip: Secondary | ICD-10-CM | POA: Diagnosis not present

## 2024-03-25 DIAGNOSIS — E119 Type 2 diabetes mellitus without complications: Secondary | ICD-10-CM | POA: Diagnosis not present

## 2024-03-25 DIAGNOSIS — Z8673 Personal history of transient ischemic attack (TIA), and cerebral infarction without residual deficits: Secondary | ICD-10-CM | POA: Diagnosis not present

## 2024-03-25 DIAGNOSIS — M1712 Unilateral primary osteoarthritis, left knee: Secondary | ICD-10-CM | POA: Diagnosis not present

## 2024-03-25 DIAGNOSIS — E785 Hyperlipidemia, unspecified: Secondary | ICD-10-CM | POA: Diagnosis not present

## 2024-03-25 DIAGNOSIS — I10 Essential (primary) hypertension: Secondary | ICD-10-CM | POA: Diagnosis not present

## 2024-03-28 DIAGNOSIS — M1712 Unilateral primary osteoarthritis, left knee: Secondary | ICD-10-CM | POA: Diagnosis not present

## 2024-04-04 DIAGNOSIS — M1712 Unilateral primary osteoarthritis, left knee: Secondary | ICD-10-CM | POA: Diagnosis not present

## 2024-04-11 DIAGNOSIS — M1712 Unilateral primary osteoarthritis, left knee: Secondary | ICD-10-CM | POA: Diagnosis not present

## 2024-04-21 DIAGNOSIS — E785 Hyperlipidemia, unspecified: Secondary | ICD-10-CM | POA: Diagnosis not present

## 2024-04-21 DIAGNOSIS — I10 Essential (primary) hypertension: Secondary | ICD-10-CM | POA: Diagnosis not present

## 2024-04-21 DIAGNOSIS — F4321 Adjustment disorder with depressed mood: Secondary | ICD-10-CM | POA: Diagnosis not present

## 2024-04-25 DIAGNOSIS — I10 Essential (primary) hypertension: Secondary | ICD-10-CM | POA: Diagnosis not present

## 2024-04-25 DIAGNOSIS — M1611 Unilateral primary osteoarthritis, right hip: Secondary | ICD-10-CM | POA: Diagnosis not present

## 2024-05-16 DIAGNOSIS — E119 Type 2 diabetes mellitus without complications: Secondary | ICD-10-CM | POA: Diagnosis not present

## 2024-05-16 DIAGNOSIS — I10 Essential (primary) hypertension: Secondary | ICD-10-CM | POA: Diagnosis not present

## 2024-05-16 DIAGNOSIS — M8588 Other specified disorders of bone density and structure, other site: Secondary | ICD-10-CM | POA: Diagnosis not present

## 2024-05-16 DIAGNOSIS — E785 Hyperlipidemia, unspecified: Secondary | ICD-10-CM | POA: Diagnosis not present

## 2024-05-16 DIAGNOSIS — Z23 Encounter for immunization: Secondary | ICD-10-CM | POA: Diagnosis not present

## 2024-05-16 DIAGNOSIS — Z0000 Encounter for general adult medical examination without abnormal findings: Secondary | ICD-10-CM | POA: Diagnosis not present

## 2024-05-16 DIAGNOSIS — Z8673 Personal history of transient ischemic attack (TIA), and cerebral infarction without residual deficits: Secondary | ICD-10-CM | POA: Diagnosis not present

## 2024-05-16 DIAGNOSIS — Z1331 Encounter for screening for depression: Secondary | ICD-10-CM | POA: Diagnosis not present

## 2024-05-16 DIAGNOSIS — E049 Nontoxic goiter, unspecified: Secondary | ICD-10-CM | POA: Diagnosis not present

## 2024-05-21 DIAGNOSIS — I10 Essential (primary) hypertension: Secondary | ICD-10-CM | POA: Diagnosis not present

## 2024-05-21 DIAGNOSIS — E785 Hyperlipidemia, unspecified: Secondary | ICD-10-CM | POA: Diagnosis not present

## 2024-05-21 DIAGNOSIS — F4321 Adjustment disorder with depressed mood: Secondary | ICD-10-CM | POA: Diagnosis not present

## 2024-05-24 NOTE — H&P (Addendum)
 TOTAL HIP ADMISSION H&P  Patient is admitted for right total hip arthroplasty.  Subjective:  Chief Complaint: Right hip pain  HPI: Stacy Levy, 89 y.o. female, has a history of pain and functional disability in the right hip due to arthritis and patient has failed non-surgical conservative treatments for greater than 12 weeks to include NSAID's and/or analgesics, use of assistive devices, and activity modification. Onset of symptoms was gradual, starting several years ago with gradually worsening course since that time. The patient noted no past surgery on the right hip. Patient currently rates pain in the right hip at 8 out of 10 with activity. Patient has night pain, worsening of pain with activity and weight bearing, and pain with passive range of motion. Patient has evidence of severe end-stage arthritis with bone-on-bone contact throughout and large osteophyte formation by imaging studies. This condition presents safety issues increasing the risk of falls. There is no current active infection.  Patient Active Problem List   Diagnosis Date Noted   Prediabetes 12/10/2015   Cerebral infarction (HCC) 12/09/2015   Stroke (cerebrum) (HCC) 12/09/2015   Obesity (BMI 30-39.9) 12/09/2015   Dyslipidemia, goal LDL below 70 12/09/2015   Hypokalemia 12/09/2015   Cerebral infarction [facility])    Nuclear sclerotic cataract 06/18/2015   Encounter for general adult medical examination without abnormal findings 08/08/2011    Past Medical History:  Diagnosis Date   Obesity     Past Surgical History:  Procedure Laterality Date   ABDOMINAL HYSTERECTOMY     APPENDECTOMY     FOOT SURGERY      Prior to Admission medications   Medication Sig Start Date End Date Taking? Authorizing Provider  atorvastatin  (LIPITOR) 40 MG tablet Take 1 tablet (40 mg total) by mouth daily at 6 PM. 12/10/15   Rama, Tawni SQUIBB, MD  clopidogrel  (PLAVIX ) 75 MG tablet Take 1 tablet (75 mg total) by mouth daily. 12/10/15    Rama, Tawni SQUIBB, MD  guaiFENesin (ROBITUSSIN) 100 MG/5ML liquid Take 200 mg by mouth 3 (three) times daily as needed for cough or congestion.    [provider]  Multiple Vitamin (THERA) TABS Take 1 tablet by mouth daily.    [provider]  valsartan-hydrochlorothiazide (DIOVAN-HCT) 320-25 MG tablet Take 1 tablet by mouth daily. 11/12/15   [provider]    Allergies  Allergen Reactions   Aspirin Other (See Comments)    Upset stomach     Social History   Socioeconomic History   Marital status: Widowed    Spouse name: Not on file   Number of children: Not on file   Years of education: Not on file   Highest education level: Not on file  Occupational History   Not on file  Tobacco Use   Smoking status: Never   Smokeless tobacco: Not on file  Substance and Sexual Activity   Alcohol use: No   Drug use: Not on file   Sexual activity: Not on file  Other Topics Concern   Not on file  Social History Narrative   Not on file   Social Drivers of Health   Financial Resource Strain: Not on file  Food Insecurity: Not on file  Transportation Needs: Not on file  Physical Activity: Not on file  Stress: Not on file  Social Connections: Not on file  Intimate Partner Violence: Not on file    Tobacco Use: Unknown (12/06/2023)   Patient History    Smoking Tobacco Use: Never    Smokeless  Tobacco Use: Unknown    Passive Exposure: Not on file   Social History   Substance and Sexual Activity  Alcohol Use No    No family history on file.  Review of Systems  Constitutional:  Negative for chills and fever.  HENT:  Negative for congestion, sore throat and tinnitus.   Eyes:  Negative for double vision, photophobia and pain.  Respiratory:  Negative for cough, shortness of breath and wheezing.   Cardiovascular:  Negative for chest pain, palpitations and orthopnea.  Gastrointestinal:  Negative for heartburn, nausea and vomiting.  Genitourinary:  Negative  for dysuria, frequency and urgency.  Musculoskeletal:  Positive for joint pain.  Neurological:  Negative for dizziness, weakness and headaches.     Objective:  Physical Exam: Well nourished and well developed.  General: Alert and oriented x3, cooperative and pleasant, no acute distress.  Head: normocephalic, atraumatic, neck supple.  Eyes: EOMI.  Musculoskeletal:  Right hip: Flexion to approximately 100 degrees, no internal rotation, 20 degrees of external rotation, and 20 degrees of abduction.  Calves soft and nontender. Motor function intact in LE. Strength 5/5 LE bilaterally. Neuro: Distal pulses 2+. Sensation to light touch intact in LE.   Imaging Review Plain radiographs demonstrate severe degenerative joint disease of the right hip. The bone quality appears to be adequate for age and reported activity level.  Assessment/Plan:  End stage arthritis, right hip  The patient history, physical examination, clinical judgement of the provider and imaging studies are consistent with end stage degenerative joint disease of the right hip and total hip arthroplasty is deemed medically necessary. The treatment options including medical management, injection therapy, arthroscopy and arthroplasty were discussed at length. The risks and benefits of total hip arthroplasty were presented and reviewed. The risks due to aseptic loosening, infection, stiffness, dislocation/subluxation, thromboembolic complications and other imponderables were discussed. The patient acknowledged the explanation, agreed to proceed with the plan and consent was signed. Patient is being admitted for inpatient treatment for surgery, pain control, PT, OT, prophylactic antibiotics, VTE prophylaxis, progressive ambulation and ADLs and discharge planning.The patient is planning to be discharged home.   Patient's anticipated LOS is less than 2 midnights, meeting these requirements: - Lives within 1 hour of care - Has a  competent adult at home to recover with post-op recover - NO history of  - Chronic pain requiring opiods  - Diabetes  - Coronary Artery Disease  - Heart failure  - Heart attack  - DVT/VTE  - Cardiac arrhythmia  - Respiratory Failure/COPD  - Renal failure  - Anemia  - Advanced Liver disease  Therapy Plans: HEP Disposition: Home with family Planned DVT Prophylaxis: Plavix  75 mg QD (cannot tolerate ASA) DME Needed: None PCP: Montie Pizza, MD (clearance received) TXA: IV Allergies: ASA Anesthesia Concerns: None BMI: 36.3 Last HgbA1c: 6.8% in 10/2023 -- not on medications Pain Regimen: Hydrocodone, tramadol Pharmacy: [facility]  Other: - Bundle patient - Only plavix  for DVT prophylaxis per Dr. Melodi - Stopping plavix  5 days prior to surgery - Patient with history of beta thalessemia -- most recent hemoglobin 10.3, which is her baseline. Discussed patient history with Dr. Montie Pizza who feels that at her baseline she is safe to proceed with surgery. Transfusion postoperatively is an option if needed.  - Patient was instructed on what medications to stop prior to surgery. - Follow-up visit in 2 weeks with Dr. Melodi - Begin physical therapy following surgery - Pre-operative lab work as pre-surgical testing - Prescriptions  will be provided in hospital at time of discharge  Roxie Mess, PA-C Orthopedic Surgery [facility] [place]

## 2024-06-12 NOTE — Patient Instructions (Signed)
 SURGICAL WAITING ROOM VISITATION  Patients having surgery or a procedure may have no more than 2 support people in the waiting area - these visitors may rotate.    Children under the age of 57 must have an adult with them who is not the patient.  Visitors with respiratory illnesses are discouraged from visiting and should remain at home.  If the patient needs to stay at the hospital during part of their recovery, the visitor guidelines for inpatient rooms apply. Pre-op nurse will coordinate an appropriate time for 1 support person to accompany patient in pre-op.  This support person may not rotate.    Please refer to the [facility] website for the visitor guidelines for Inpatients (after your surgery is over and you are in a regular room).       Your procedure is scheduled on: 06/19/24   Report to [facility] Main Entrance    Report to admitting at 8:30 AM   Call this number if you have problems the morning of surgery [phone]   Do not eat food :After Midnight.   After Midnight you may have the following liquids until 7:55 AM DAY OF SURGERY  Water Non-Citrus Juices (without pulp, NO RED-Apple, White grape, White cranberry) Black Coffee (NO MILK/CREAM OR CREAMERS, sugar ok)  Clear Tea (NO MILK/CREAM OR CREAMERS, sugar ok) regular and decaf                             Plain Jell-O (NO RED)                                           Fruit ices (not with fruit pulp, NO RED)                                     Popsicles (NO RED)                                                               Sports drinks like Gatorade (NO RED)                   The day of surgery:  Drink ONE (1) Pre-Surgery Clear Ensure at 7:55 AM the morning of surgery. Drink in one sitting. Do not sip.  This drink was given to you during your hospital  pre-op appointment visit. Nothing else to drink after completing the  Pre-Surgery Clear Ensure     DENTURES WILL BE REMOVED PRIOR TO SURGERY  PLEASE DO NOT APPLY Poly grip OR ADHESIVES!!!    Stop all vitamins and herbal supplements 7 days before surgery.   Take these medicines the morning of surgery with A SIP OF WATER: Atorvastatin , Myrbetriq.               Do not take Diovan(valsartan/hydrochlorothiazide) the morning of surgery.                         You may not have any metal on your body including hair pins, jewelry, and  body piercing             Do not wear make-up, lotions, powders, perfumes/cologne, or deodorant  Do not wear nail polish including gel and S&S, artificial/acrylic nails, or any other type of covering on natural nails including finger and toenails. If you have artificial nails, gel coating, etc. that needs to be removed by a nail salon please have this removed prior to surgery or surgery may need to be canceled/ delayed if the surgeon/ anesthesia feels like they are unable to be safely monitored.   Do not shave  48 hours prior to surgery.               Do not bring valuables to the hospital. [place] IS NOT             RESPONSIBLE   FOR VALUABLES.   Contacts, glasses, dentures or bridgework may not be worn into surgery.   Bring small overnight bag day of surgery.   DO NOT BRING YOUR HOME MEDICATIONS TO THE HOSPITAL. PHARMACY WILL DISPENSE MEDICATIONS LISTED ON YOUR MEDICATION LIST TO YOU DURING YOUR ADMISSION IN THE HOSPITAL!    Patients discharged on the day of surgery will not be allowed to drive home.  Someone NEEDS to stay with you for the first 24 hours after anesthesia.   Special Instructions: Bring a copy of your healthcare power of attorney and living will documents the day of surgery if you haven't scanned them before.              Please read over the following fact sheets you were given: IF YOU HAVE QUESTIONS ABOUT YOUR PRE-OP INSTRUCTIONS PLEASE CALL [phone] Stacy Levy   If you received a COVID test during your pre-op visit  it is requested that you wear a mask when out in public,  stay away from anyone that may not be feeling well and notify your surgeon if you develop symptoms. If you test positive for Covid or have been in contact with anyone that has tested positive in the last 10 days please notify you surgeon.      Pre-operative 4 CHG Bath Instructions  DYNA-Hex 4 Chlorhexidine Gluconate 4% Solution Antiseptic 4 fl. oz   You can play a key role in reducing the risk of infection after surgery. Your skin needs to be as free of germs as possible. You can reduce the number of germs on your skin by washing with CHG (chlorhexidine gluconate) soap before surgery. CHG is an antiseptic soap that kills germs and continues to kill germs even after washing.   DO NOT use if you have an allergy to chlorhexidine/CHG or antibacterial soaps. If your skin becomes reddened or irritated, stop using the CHG and notify one of our RNs at   Please shower with the CHG soap starting 4 days before surgery using the following schedule:     Please keep in mind the following:  DO NOT shave, including legs and underarms, starting the day of your first shower.   You may shave your face at any point before/day of surgery.  Place clean sheets on your bed the day you start using CHG soap. Use a clean washcloth (not used since being washed) for each shower. DO NOT sleep with pets once you start using the CHG.  CHG Shower Instructions:  If you choose to wash your hair and private area, wash first with your normal shampoo/soap.  After you use shampoo/soap, rinse your hair and body thoroughly to  remove shampoo/soap residue.  Turn the water OFF and apply about 3 tablespoons (45 ml) of CHG soap to a CLEAN washcloth.  Apply CHG soap ONLY FROM YOUR NECK DOWN TO YOUR TOES (washing for 3-5 minutes)  DO NOT use CHG soap on face, private areas, open wounds, or sores.  Pay special attention to the area where your surgery is being performed.  If you are having back surgery, having someone wash your back for  you may be helpful. Wait 2 minutes after CHG soap is applied, then you may rinse off the CHG soap.  Pat dry with a clean towel  Put on clean clothes/pajamas   If you choose to wear lotion, please use ONLY the CHG-compatible lotions on the back of this paper.     Additional instructions for the day of surgery: DO NOT APPLY any lotions, deodorants, cologne, or perfumes.   Put on clean/comfortable clothes.  Brush your teeth.  Ask your nurse before applying any prescription medications to the skin.   CHG Compatible Lotions   Aveeno Moisturizing lotion  Cetaphil Moisturizing Cream  Cetaphil Moisturizing Lotion  Clairol Herbal Essence Moisturizing Lotion, Dry Skin  Clairol Herbal Essence Moisturizing Lotion, Extra Dry Skin  Clairol Herbal Essence Moisturizing Lotion, Normal Skin  Curel Age Defying Therapeutic Moisturizing Lotion with Alpha Hydroxy  Curel Extreme Care Body Lotion  Curel Soothing Hands Moisturizing Hand Lotion  Curel Therapeutic Moisturizing Cream, Fragrance-Free  Curel Therapeutic Moisturizing Lotion, Fragrance-Free  Curel Therapeutic Moisturizing Lotion, Original Formula  Eucerin Daily Replenishing Lotion  Eucerin Dry Skin Therapy Plus Alpha Hydroxy Crme  Eucerin Dry Skin Therapy Plus Alpha Hydroxy Lotion  Eucerin Original Crme  Eucerin Original Lotion  Eucerin Plus Crme Eucerin Plus Lotion  Eucerin TriLipid Replenishing Lotion  Keri Anti-Bacterial Hand Lotion  Keri Deep Conditioning Original Lotion Dry Skin Formula Softly Scented  Keri Deep Conditioning Original Lotion, Fragrance Free Sensitive Skin Formula  Keri Lotion Fast Absorbing Fragrance Free Sensitive Skin Formula  Keri Lotion Fast Absorbing Softly Scented Dry Skin Formula  Keri Original Lotion  Keri Skin Renewal Lotion Keri Silky Smooth Lotion  Keri Silky Smooth Sensitive Skin Lotion  Nivea Body Creamy Conditioning Oil  Nivea Body Extra Enriched Lotion  Nivea Body Original Lotion  Nivea Body Sheer  Moisturizing Lotion Nivea Crme  Nivea Skin Firming Lotion  NutraDerm 30 Skin Lotion  NutraDerm Skin Lotion  NutraDerm Therapeutic Skin Cream  NutraDerm Therapeutic Skin Lotion  ProShield Protective Hand Cream  Incentive Spirometer   An incentive spirometer is a tool that can help keep your lungs clear and active. This tool measures how well you are filling your lungs with each breath. Taking long deep breaths may help reverse or decrease the chance of developing breathing (pulmonary) problems (especially infection) following: A long period of time when you are unable to move or be active. BEFORE THE PROCEDURE  If the spirometer includes an indicator to show your best effort, your nurse or respiratory therapist will set it to a desired goal. If possible, sit up straight or lean slightly forward. Try not to slouch. Hold the incentive spirometer in an upright position. INSTRUCTIONS FOR USE  Sit on the edge of your bed if possible, or sit up as far as you can in bed or on a chair. Hold the incentive spirometer in an upright position. Breathe out normally. Place the mouthpiece in your mouth and seal your lips tightly around it. Breathe in slowly and as deeply as possible, raising the piston or  the ball toward the top of the column. Hold your breath for 3-5 seconds or for as long as possible. Allow the piston or ball to fall to the bottom of the column. Remove the mouthpiece from your mouth and breathe out normally. Rest for a few seconds and repeat Steps 1 through 7 at least 10 times every 1-2 hours when you are awake. Take your time and take a few normal breaths between deep breaths. The spirometer may include an indicator to show your best effort. Use the indicator as a goal to work toward during each repetition. After each set of 10 deep breaths, practice coughing to be sure your lungs are clear. If you have an incision (the cut made at the time of surgery), support your incision when  coughing by placing a pillow or rolled up towels firmly against it. Once you are able to get out of bed, walk around indoors and cough well. You may stop using the incentive spirometer when instructed by your caregiver.  RISKS AND COMPLICATIONS Take your time so you do not get dizzy or light-headed. If you are in pain, you may need to take or ask for pain medication before doing incentive spirometry. It is harder to take a deep breath if you are having pain. AFTER USE Rest and breathe slowly and easily. It can be helpful to keep track of a log of your progress. Your caregiver can provide you with a simple table to help with this. If you are using the spirometer at home, follow these instructions: SEEK MEDICAL CARE IF:  You are having difficultly using the spirometer. You have trouble using the spirometer as often as instructed. Your pain medication is not giving enough relief while using the spirometer. You develop fever of 100.5 F (38.1 C) or higher. SEEK IMMEDIATE MEDICAL CARE IF:  You cough up bloody sputum that had not been present before. You develop fever of 102 F (38.9 C) or greater. You develop worsening pain at or near the incision site. MAKE SURE YOU:  Understand these instructions. Will watch your condition. Will get help right away if you are not doing well or get worse.   WHAT IS A BLOOD TRANSFUSION? Blood Transfusion Information  A transfusion is the replacement of blood or some of its parts. Blood is made up of multiple cells which provide different functions. Red blood cells carry oxygen and are used for blood loss replacement. White blood cells fight against infection. Platelets control bleeding. Plasma helps clot blood. Other blood products are available for specialized needs, such as hemophilia or other clotting disorders. BEFORE THE TRANSFUSION  Who gives blood for transfusions?  Healthy volunteers who are fully evaluated to make sure their blood is safe.  This is blood bank blood. Transfusion therapy is the safest it has ever been in the practice of medicine. Before blood is taken from a donor, a complete history is taken to make sure that person has no history of diseases nor engages in risky social behavior (examples are intravenous drug use or sexual activity with multiple partners). The donor's travel history is screened to minimize risk of transmitting infections, such as malaria. The donated blood is tested for signs of infectious diseases, such as HIV and hepatitis. The blood is then tested to be sure it is compatible with you in order to minimize the chance of a transfusion reaction. If you or a relative donates blood, this is often done in anticipation of surgery and is not appropriate for  emergency situations. It takes many days to process the donated blood. RISKS AND COMPLICATIONS Although transfusion therapy is very safe and saves many lives, the main dangers of transfusion include:  Getting an infectious disease. Developing a transfusion reaction. This is an allergic reaction to something in the blood you were given. Every precaution is taken to prevent this. The decision to have a blood transfusion has been considered carefully by your caregiver before blood is given. Blood is not given unless the benefits outweigh the risks. AFTER THE TRANSFUSION Right after receiving a blood transfusion, you will usually feel much better and more energetic. This is especially true if your red blood cells have gotten low (anemic). The transfusion raises the level of the red blood cells which carry oxygen, and this usually causes an energy increase. The nurse administering the transfusion will monitor you carefully for complications. HOME CARE INSTRUCTIONS  No special instructions are needed after a transfusion. You may find your energy is better. Speak with your caregiver about any limitations on activity for underlying diseases you may have. SEEK MEDICAL  CARE IF:  Your condition is not improving after your transfusion. You develop redness or irritation at the intravenous (IV) site. SEEK IMMEDIATE MEDICAL CARE IF:  Any of the following symptoms occur over the next 12 hours: Shaking chills. You have a temperature by mouth above 102 F (38.9 C), not controlled by medicine. Chest, back, or muscle pain. People around you feel you are not acting correctly or are confused. Shortness of breath or difficulty breathing. Dizziness and fainting. You get a rash or develop hives. You have a decrease in urine output. Your urine turns a dark color or changes to pink, red, or brown. Any of the following symptoms occur over the next 10 days: You have a temperature by mouth above 102 F (38.9 C), not controlled by medicine. Shortness of breath. Weakness after normal activity. The white part of the eye turns yellow (jaundice). You have a decrease in the amount of urine or are urinating less often. Your urine turns a dark color or changes to pink, red, or brown. Document Released: 08/05/2000 Document Revised: 10/31/2011 Document Reviewed: 03/24/2008 [facility] Patient Information 2014 [place], [place].

## 2024-06-12 NOTE — Progress Notes (Signed)
 COVID Vaccine received:  []  No [x]  Yes Date of any COVID positive Test in last 90 days: no PCP - Dr. Montie Pizza  Cardiologist - n/a  Chest x-ray -  EKG -   Stress Test -  ECHO - 12/10/15 Epic Cardiac Cath -   Bowel Prep - [x]  No  []   Yes ______  Pacemaker / ICD device [x]  No []  Yes   Spinal Cord Stimulator:[x]  No []  Yes       History of Sleep Apnea? [x]  No []  Yes   CPAP used?- [x]  No []  Yes    Does the patient monitor blood sugar?          []  No []  Yes  []  N/A  Patient has: [x]  NO Hx DM   []  Pre-DM                 []  DM1  []   DM2 Does patient have a [employer] or Dexacom? []  No []  Yes   Fasting Blood Sugar Ranges-  Checks Blood Sugar _____ times a day  GLP1 agonist / usual dose - no GLP1 instructions:  SGLT-2 inhibitors / usual dose - no SGLT-2 instructions:   Blood Thinner / Instructions:Plavix  last dose 06/12/24 4PM. Dr. Pizza said this was ok because pt. Was out of her prescription after that day per pt's niece. Aspirin Instructions:no  Comments:   Activity level: Patient is  unable to climb a flight of stairs without difficulty; [x]  No CP  [x]  No SOB, but would have _hip dysfunction__   Patient can perform ADLs without assistance.   Anesthesia review: stroke, On Plavix .  Patient denies shortness of breath, fever, cough and chest pain at PAT appointment.  Patient verbalized understanding and agreement to the Pre-Surgical Instructions that were given to them at this PAT appointment. Patient was also educated of the need to review these PAT instructions again prior to his/her surgery.I reviewed the appropriate phone numbers to call if they have any and questions or concerns.

## 2024-06-14 ENCOUNTER — Encounter (HOSPITAL_COMMUNITY)
Admission: RE | Admit: 2024-06-14 | Discharge: 2024-06-14 | Disposition: A | Discharge status: Home / Self Care | Source: Ambulatory Visit | Attending: Orthopedic Surgery | Admitting: Orthopedic Surgery

## 2024-06-14 ENCOUNTER — Encounter (HOSPITAL_COMMUNITY): Payer: Self-pay

## 2024-06-14 ENCOUNTER — Other Ambulatory Visit: Payer: Self-pay

## 2024-06-14 VITALS — BP 148/76 | HR 66 | Temp 98.2°F | Resp 18 | Ht 62.0 in | Wt 208.0 lb

## 2024-06-14 DIAGNOSIS — Z8673 Personal history of transient ischemic attack (TIA), and cerebral infarction without residual deficits: Secondary | ICD-10-CM | POA: Insufficient documentation

## 2024-06-14 DIAGNOSIS — Z01812 Encounter for preprocedural laboratory examination: Secondary | ICD-10-CM | POA: Insufficient documentation

## 2024-06-14 DIAGNOSIS — M1611 Unilateral primary osteoarthritis, right hip: Secondary | ICD-10-CM | POA: Diagnosis not present

## 2024-06-14 DIAGNOSIS — Z01818 Encounter for other preprocedural examination: Secondary | ICD-10-CM

## 2024-06-14 DIAGNOSIS — E669 Obesity, unspecified: Secondary | ICD-10-CM | POA: Insufficient documentation

## 2024-06-14 DIAGNOSIS — I10 Essential (primary) hypertension: Secondary | ICD-10-CM | POA: Diagnosis not present

## 2024-06-14 DIAGNOSIS — Z7902 Long term (current) use of antithrombotics/antiplatelets: Secondary | ICD-10-CM | POA: Diagnosis not present

## 2024-06-14 HISTORY — DX: Unspecified osteoarthritis, unspecified site: M19.90

## 2024-06-14 HISTORY — DX: Essential (primary) hypertension: I10

## 2024-06-14 LAB — BASIC METABOLIC PANEL WITH GFR
Anion gap: 10 (ref 5–15)
BUN: 15 mg/dL (ref 8–23)
CO2: 27 mmol/L (ref 22–32)
Calcium: 9.6 mg/dL (ref 8.9–10.3)
Chloride: 104 mmol/L (ref 98–111)
Creatinine, Ser: 0.74 mg/dL (ref 0.44–1.00)
GFR, Estimated: >60 mL/min (ref >60)
Glucose, Bld: 104 mg/dL — ABNORMAL HIGH (ref 70–99)
Potassium: 3.7 mmol/L (ref 3.5–5.1)
Sodium: 141 mmol/L (ref 135–145)

## 2024-06-14 LAB — CBC
HCT: 34.7 % — ABNORMAL LOW (ref 36.0–46.0)
Hemoglobin: 9.8 g/dL — ABNORMAL LOW (ref 12.0–15.0)
MCH: 18.5 pg — ABNORMAL LOW (ref 26.0–34.0)
MCHC: 28.2 g/dL — ABNORMAL LOW (ref 30.0–36.0)
MCV: 65.6 fL — ABNORMAL LOW (ref 80.0–100.0)
Platelets: 241 K/uL (ref 150–400)
RBC: 5.29 MIL/uL — ABNORMAL HIGH (ref 3.87–5.11)
RDW: 15.9 % — ABNORMAL HIGH (ref 11.5–15.5)
WBC: 4.3 K/uL (ref 4.0–10.5)
nRBC: 0 % (ref 0.0–0.2)

## 2024-06-14 LAB — SURGICAL PCR SCREEN
MRSA, PCR: NEGATIVE
Staphylococcus aureus: NEGATIVE

## 2024-06-14 NOTE — Progress Notes (Signed)
 Request sent to Dr. Melodi to view pre op CBC drawn 06/14/24.

## 2024-06-17 NOTE — Progress Notes (Addendum)
 Case: [id number] Date/Time: 06/19/24 1040   Procedure: ARTHROPLASTY, HIP, TOTAL, ANTERIOR APPROACH (Right: Hip)   Anesthesia type: Choice   Pre-op diagnosis: Right hip osteoarthritis   Location: [facility] ROOM 09 / [facility]   Surgeons: Melodi Lerner, MD       DISCUSSION: Stacy Levy is a 89 yo female with PMH of HTN, hx of CVA (2017) on Plavix , obesity, arthritis, anemia.  Seen by PCP on 03/28/24 for pre op clearance.  All issues stable at that visit. Cleared as moderate risk from cardiac/medical perspective (clearance scanned in media on ):  Patient is set on undergoing THA for management, but has had concerns over what to expect. Further discussed patient's concerns and advised to further discuss with Dr. Hiram. Will hold on EKG and blood work today until patient knows when and where surgery will be scheduled at, if scheduled prior to AWV in 04/2024 she may schedule NV to have these done prior to surgery. Discussed need to discontinue clopidogrel  prior to surgery, and risks of surgery due to her age and h/o CVA discussed with patient and nieces today. Assuming labs return normal once taken, patient is cleared medically for surgery at this time. She does understand she is higher risk simply due to her age.  LD Plavix  10/22  Hgb noted to be 9.8 which is slightly decreased from prior which was 10.4 on 05/17/24 at PCP office Stacy Levy). Per [name] with Dr. Posey office they will reschedule patient  VS: BP (!) 148/76   Pulse 66   Temp 36.8 C (Oral)   Resp 18   Ht 5' 2 (1.575 m)   Wt 94.3 kg   SpO2 97%   BMI 38.04 kg/m   PROVIDERS: Teresa Channel, MD   LABS:   Labs Reviewed  BASIC METABOLIC PANEL WITH GFR - Abnormal; Notable for the following components:      Result Value   Glucose, Bld 104 (*)    All other components within normal limits  CBC - Abnormal; Notable for the following components:   RBC 5.29 (*)    Hemoglobin 9.8 (*)    HCT 34.7 (*)    MCV 65.6 (*)    MCH 18.5 (*)     MCHC 28.2 (*)    RDW 15.9 (*)    All other components within normal limits  SURGICAL PCR SCREEN  TYPE AND SCREEN     IMAGES:   EKG - not done in PAT. Obtain DOS   Echo 12/10/2015:  Study Conclusions  - Left ventricle: The cavity size was normal. There was mild   concentric hypertrophy. Systolic function was vigorous. The   estimated ejection fraction was in the range of 65% to 70%. Wall   motion was normal; there were no regional wall motion   abnormalities. Doppler parameters are consistent with abnormal   left ventricular relaxation (grade 1 diastolic dysfunction).  Past Medical History:  Diagnosis Date   Arthritis    Hypertension    Obesity     Past Surgical History:  Procedure Laterality Date   ABDOMINAL HYSTERECTOMY     APPENDECTOMY     CATARACT EXTRACTION Bilateral    FOOT SURGERY      MEDICATIONS:  atorvastatin  (LIPITOR) 40 MG tablet   clopidogrel  (PLAVIX ) 75 MG tablet   FIBER GUMMIES PO   Menthol-Camphor (TIGER BALM ARTHRITIS RUB EX)   MYRBETRIQ 50 MG TB24 tablet   OVER THE COUNTER MEDICATION   valsartan-hydrochlorothiazide (DIOVAN-HCT) 320-25 MG tablet   No current  facility-administered medications for this encounter.   Burnard CHRISTELLA Odis DEVONNA [facility] Surgical Short Stay/Anesthesiology [facility] Phone [phone] 06/17/2024 11:32 AM

## 2024-06-19 DIAGNOSIS — D649 Anemia, unspecified: Secondary | ICD-10-CM | POA: Diagnosis not present

## 2024-06-19 DIAGNOSIS — Z8673 Personal history of transient ischemic attack (TIA), and cerebral infarction without residual deficits: Secondary | ICD-10-CM | POA: Diagnosis not present

## 2024-06-19 DIAGNOSIS — R600 Localized edema: Secondary | ICD-10-CM | POA: Diagnosis not present

## 2024-06-19 DIAGNOSIS — I10 Essential (primary) hypertension: Secondary | ICD-10-CM | POA: Diagnosis not present

## 2024-06-19 DIAGNOSIS — D563 Thalassemia minor: Secondary | ICD-10-CM | POA: Diagnosis not present

## 2024-06-19 LAB — TYPE AND SCREEN
ABO/RH(D): A POS
Antibody Screen: NEGATIVE

## 2024-06-21 DIAGNOSIS — E785 Hyperlipidemia, unspecified: Secondary | ICD-10-CM | POA: Diagnosis not present

## 2024-06-21 DIAGNOSIS — I10 Essential (primary) hypertension: Secondary | ICD-10-CM | POA: Diagnosis not present

## 2024-06-21 DIAGNOSIS — F4321 Adjustment disorder with depressed mood: Secondary | ICD-10-CM | POA: Diagnosis not present

## 2024-07-21 DIAGNOSIS — I10 Essential (primary) hypertension: Secondary | ICD-10-CM | POA: Diagnosis not present

## 2024-07-21 DIAGNOSIS — F4321 Adjustment disorder with depressed mood: Secondary | ICD-10-CM | POA: Diagnosis not present

## 2024-07-21 DIAGNOSIS — E785 Hyperlipidemia, unspecified: Secondary | ICD-10-CM | POA: Diagnosis not present

## 2024-08-19 NOTE — Patient Instructions (Signed)
 SURGICAL WAITING ROOM VISITATION Patients having surgery or a procedure may have no more than 2 support people in the waiting area - these visitors may rotate in the visitor waiting room.   Due to an increase in RSV and influenza rates and associated hospitalizations, children ages 28 and under may not visit patients in [facility] hospitals. If the patient needs to stay at the hospital during part of their recovery, the visitor guidelines for inpatient rooms apply.  PRE-OP VISITATION  Pre-op nurse will coordinate an appropriate time for 1 support person to accompany the patient in pre-op.  This support person may not rotate.  This visitor will be contacted when the time is appropriate for the visitor to come back in the pre-op area.  Please refer to the [facility] website for the visitor guidelines for Inpatients (after your surgery is over and you are in a regular room).  You are not required to quarantine at this time prior to your surgery. However, you must do this: Hand Hygiene often Do NOT share personal items Notify your provider if you are in close contact with someone who has COVID or you develop fever 100.4 or greater, new onset of sneezing, cough, sore throat, shortness of breath or body aches.  If you test positive for Covid or have been in contact with anyone that has tested positive in the last 10 days please notify you surgeon.    Your procedure is scheduled on: 08/28/2024   Report to [facility] Main Entrance: [place] entrance where the [employer] is available.   Report to admitting at: 8:15 AM  Call this number if you have any questions or problems the morning of surgery [phone]  FOLLOW ANY ADDITIONAL PRE OP INSTRUCTIONS YOU RECEIVED FROM YOUR SURGEON'S OFFICE!!!  Do not eat food after Midnight the night prior to your surgery/procedure.  After Midnight you may have the following liquids until: 7:45 AM DAY OF SURGERY  Clear Liquid Diet Water Black  Coffee (sugar ok, NO MILK/CREAM OR CREAMERS)  Tea (sugar ok, NO MILK/CREAM OR CREAMERS) regular and decaf                             Plain Jell-O  with no fruit (NO RED)                                           Fruit ices (not with fruit pulp, NO RED)                                     Popsicles (NO RED)                                                                  Juice: NO CITRUS JUICES: only apple, WHITE grape, WHITE cranberry Sports drinks like Gatorade or Powerade (NO RED)   The day of surgery:  Drink ONE (1) Pre-Surgery Clear Ensure at : 7:45 AM the morning of surgery. Drink in one sitting. Do not sip.  This drink was given  to you during your hospital pre-op appointment visit. Nothing else to drink after completing the Pre-Surgery Clear Ensure or G2 : No candy, chewing gum or throat lozenges.    Oral Hygiene is also important to reduce your risk of infection.        Remember - BRUSH YOUR TEETH THE MORNING OF SURGERY WITH YOUR REGULAR TOOTHPASTE  Do NOT smoke after Midnight the night before surgery.  STOP TAKING all Vitamins, Herbs and supplements 1 week before your surgery.   Take ONLY these medicines the morning of surgery with A SIP OF WATER: myrbetriq.     Before surgery.Stop taking ___________on __________as instructed by _____________.  Stop taking ____________as directed by your Surgeon/Cardiologist.  Contact your Surgeon/Cardiologist for instructions on Anticoagulant Therapy prior to surgery.                 You may not have any metal on your body including hair pins, jewelry, and body piercing  Do not wear make-up, lotions, powders, perfumes / cologne, or deodorant  Do not wear nail polish including gel and S&S, artificial / acrylic nails, or any other type of covering on natural nails including finger and toenails. If you have artificial nails, gel coating, etc., that needs to be removed by a nail salon, Please have this removed prior to surgery. Not doing so may  mean that your surgery could be cancelled or delayed if the Surgeon or anesthesia staff feels like they are unable to monitor you safely.   Do not shave 48 hours prior to surgery to avoid nicks in your skin which may contribute to postoperative infections.   Contacts, Hearing Aids, dentures or bridgework may not be worn into surgery. DENTURES WILL BE REMOVED PRIOR TO SURGERY PLEASE DO NOT APPLY Poly grip OR ADHESIVES!!!  You may bring a small overnight bag with you on the day of surgery, only pack items that are not valuable. [facility] IS NOT RESPONSIBLE   FOR VALUABLES THAT ARE LOST OR STOLEN.   Patients discharged on the day of surgery will not be allowed to drive home.  Someone NEEDS to stay with you for the first 24 hours after anesthesia.  Do not bring your home medications to the hospital. The Pharmacy will dispense medications listed on your medication list to you during your admission in the Hospital.  Special Instructions: Bring a copy of your healthcare power of attorney and living will documents the day of surgery, if you wish to have them scanned into your [place] Medical Records- EPIC  Please read over the following fact sheets you were given: IF YOU HAVE QUESTIONS ABOUT YOUR PRE-OP INSTRUCTIONS, PLEASE CALL [phone]  PATIENT SIGNATURE_________________________________  NURSE SIGNATURE__________________________________  ________________________________________________________________________  Pre-operative 4 CHG Bath Instructions  DYNA-Hex 4 Chlorhexidine  Gluconate 4% Solution Antiseptic 4 fl. oz   You can play a key role in reducing the risk of infection after surgery. Your skin needs to be as free of germs as possible. You can reduce the number of germs on your skin by washing with CHG (chlorhexidine  gluconate) soap before surgery. CHG is an antiseptic soap that kills germs and continues to kill germs even after washing.   DO NOT use if you have an allergy to  chlorhexidine /CHG or antibacterial soaps. If your skin becomes reddened or irritated, stop using the CHG and notify one of our RNs at   Please shower with the CHG soap starting 4 days before surgery using the following schedule:     Please  keep in mind the following:  DO NOT shave, including legs and underarms, starting the day of your first shower.   You may shave your face at any point before/day of surgery.  Place clean sheets on your bed the day you start using CHG soap. Use a clean washcloth (not used since being washed) for each shower. DO NOT sleep with pets once you start using the CHG.  CHG Shower Instructions:  If you choose to wash your hair and private area, wash first with your normal shampoo/soap.  After you use shampoo/soap, rinse your hair and body thoroughly to remove shampoo/soap residue.  Turn the water OFF and apply about 3 tablespoons (45 ml) of CHG soap to a CLEAN washcloth.  Apply CHG soap ONLY FROM YOUR NECK DOWN TO YOUR TOES (washing for 3-5 minutes)  DO NOT use CHG soap on face, private areas, open wounds, or sores.  Pay special attention to the area where your surgery is being performed.  If you are having back surgery, having someone wash your back for you may be helpful. Wait 2 minutes after CHG soap is applied, then you may rinse off the CHG soap.  Pat dry with a clean towel  Put on clean clothes/pajamas   If you choose to wear lotion, please use ONLY the CHG-compatible lotions on the back of this paper.     Additional instructions for the day of surgery: DO NOT APPLY any lotions, deodorants, cologne, or perfumes.   Put on clean/comfortable clothes.  Brush your teeth.  Ask your nurse before applying any prescription medications to the skin.   CHG Compatible Lotions   Aveeno Moisturizing lotion  Cetaphil Moisturizing Cream  Cetaphil Moisturizing Lotion  Clairol Herbal Essence Moisturizing Lotion, Dry Skin  Clairol Herbal Essence Moisturizing Lotion,  Extra Dry Skin  Clairol Herbal Essence Moisturizing Lotion, Normal Skin  Curel Age Defying Therapeutic Moisturizing Lotion with Alpha Hydroxy  Curel Extreme Care Body Lotion  Curel Soothing Hands Moisturizing Hand Lotion  Curel Therapeutic Moisturizing Cream, Fragrance-Free  Curel Therapeutic Moisturizing Lotion, Fragrance-Free  Curel Therapeutic Moisturizing Lotion, Original Formula  Eucerin Daily Replenishing Lotion  Eucerin Dry Skin Therapy Plus Alpha Hydroxy Crme  Eucerin Dry Skin Therapy Plus Alpha Hydroxy Lotion  Eucerin Original Crme  Eucerin Original Lotion  Eucerin Plus Crme Eucerin Plus Lotion  Eucerin TriLipid Replenishing Lotion  Keri Anti-Bacterial Hand Lotion  Keri Deep Conditioning Original Lotion Dry Skin Formula Softly Scented  Keri Deep Conditioning Original Lotion, Fragrance Free Sensitive Skin Formula  Keri Lotion Fast Absorbing Fragrance Free Sensitive Skin Formula  Keri Lotion Fast Absorbing Softly Scented Dry Skin Formula  Keri Original Lotion  Keri Skin Renewal Lotion Keri Silky Smooth Lotion  Keri Silky Smooth Sensitive Skin Lotion  Nivea Body Creamy Conditioning Oil  Nivea Body Extra Enriched Lotion  Nivea Body Original Lotion  Nivea Body Sheer Moisturizing Lotion Nivea Crme  Nivea Skin Firming Lotion  NutraDerm 30 Skin Lotion  NutraDerm Skin Lotion  NutraDerm Therapeutic Skin Cream  NutraDerm Therapeutic Skin Lotion  ProShield Protective Hand Cream  Provon moisturizing lotion  Incentive Spirometer  An incentive spirometer is a tool that can help keep your lungs clear and active. This tool measures how well you are filling your lungs with each breath. Taking long deep breaths may help reverse or decrease the chance of developing breathing (pulmonary) problems (especially infection) following: A long period of time when you are unable to move or be active. BEFORE THE PROCEDURE  If the  spirometer includes an indicator to show your best effort, your  nurse or respiratory therapist will set it to a desired goal. If possible, sit up straight or lean slightly forward. Try not to slouch. Hold the incentive spirometer in an upright position. INSTRUCTIONS FOR USE  Sit on the edge of your bed if possible, or sit up as far as you can in bed or on a chair. Hold the incentive spirometer in an upright position. Breathe out normally. Place the mouthpiece in your mouth and seal your lips tightly around it. Breathe in slowly and as deeply as possible, raising the piston or the ball toward the top of the column. Hold your breath for 3-5 seconds or for as long as possible. Allow the piston or ball to fall to the bottom of the column. Remove the mouthpiece from your mouth and breathe out normally. Rest for a few seconds and repeat Steps 1 through 7 at least 10 times every 1-2 hours when you are awake. Take your time and take a few normal breaths between deep breaths. The spirometer may include an indicator to show your best effort. Use the indicator as a goal to work toward during each repetition. After each set of 10 deep breaths, practice coughing to be sure your lungs are clear. If you have an incision (the cut made at the time of surgery), support your incision when coughing by placing a pillow or rolled up towels firmly against it. Once you are able to get out of bed, walk around indoors and cough well. You may stop using the incentive spirometer when instructed by your caregiver.  RISKS AND COMPLICATIONS Take your time so you do not get dizzy or light-headed. If you are in pain, you may need to take or ask for pain medication before doing incentive spirometry. It is harder to take a deep breath if you are having pain. AFTER USE Rest and breathe slowly and easily. It can be helpful to keep track of a log of your progress. Your caregiver can provide you with a simple table to help with this. If you are using the spirometer at home, follow these  instructions: SEEK MEDICAL CARE IF:  You are having difficultly using the spirometer. You have trouble using the spirometer as often as instructed. Your pain medication is not giving enough relief while using the spirometer. You develop fever of 100.5 F (38.1 C) or higher. SEEK IMMEDIATE MEDICAL CARE IF:  You cough up bloody sputum that had not been present before. You develop fever of 102 F (38.9 C) or greater. You develop worsening pain at or near the incision site. MAKE SURE YOU:  Understand these instructions. Will watch your condition. Will get help right away if you are not doing well or get worse. Document Released: 12/19/2006 Document Revised: 10/31/2011 Document Reviewed: 02/19/2007 [facility] Patient Information 2014 [place], [place].   ________________________________________________________________________

## 2024-08-26 ENCOUNTER — Other Ambulatory Visit: Payer: Self-pay

## 2024-08-26 ENCOUNTER — Encounter (HOSPITAL_COMMUNITY)
Admission: RE | Admit: 2024-08-26 | Discharge: 2024-08-26 | Disposition: A | Discharge status: Home / Self Care | Source: Ambulatory Visit | Attending: Orthopedic Surgery | Admitting: Orthopedic Surgery

## 2024-08-26 ENCOUNTER — Encounter (HOSPITAL_COMMUNITY): Payer: Self-pay

## 2024-08-26 VITALS — BP 128/65 | HR 62 | Temp 98.2°F | Ht 62.0 in | Wt 205.0 lb

## 2024-08-26 DIAGNOSIS — I6350 Cerebral infarction due to unspecified occlusion or stenosis of unspecified cerebral artery: Secondary | ICD-10-CM

## 2024-08-26 DIAGNOSIS — Z01818 Encounter for other preprocedural examination: Secondary | ICD-10-CM | POA: Insufficient documentation

## 2024-08-26 DIAGNOSIS — M1611 Unilateral primary osteoarthritis, right hip: Secondary | ICD-10-CM | POA: Diagnosis not present

## 2024-08-26 DIAGNOSIS — Z8673 Personal history of transient ischemic attack (TIA), and cerebral infarction without residual deficits: Secondary | ICD-10-CM | POA: Diagnosis not present

## 2024-08-26 HISTORY — DX: Beta thalassemia: D56.1

## 2024-08-26 HISTORY — DX: Cerebral infarction, unspecified: I63.9

## 2024-08-26 LAB — BASIC METABOLIC PANEL WITH GFR
Anion gap: 10 (ref 5–15)
BUN: 28 mg/dL — ABNORMAL HIGH (ref 8–23)
CO2: 26 mmol/L (ref 22–32)
Calcium: 9.5 mg/dL (ref 8.9–10.3)
Chloride: 103 mmol/L (ref 98–111)
Creatinine, Ser: 0.76 mg/dL (ref 0.44–1.00)
GFR, Estimated: >60 mL/min
Glucose, Bld: 112 mg/dL — ABNORMAL HIGH (ref 70–99)
Potassium: 3.5 mmol/L (ref 3.5–5.1)
Sodium: 140 mmol/L (ref 135–145)

## 2024-08-26 LAB — CBC
HCT: 32.8 % — ABNORMAL LOW (ref 36.0–46.0)
Hemoglobin: 9.8 g/dL — ABNORMAL LOW (ref 12.0–15.0)
MCH: 19.2 pg — ABNORMAL LOW (ref 26.0–34.0)
MCHC: 29.9 g/dL — ABNORMAL LOW (ref 30.0–36.0)
MCV: 64.3 fL — ABNORMAL LOW (ref 80.0–100.0)
Platelets: 238 K/uL (ref 150–400)
RBC: 5.1 MIL/uL (ref 3.87–5.11)
RDW: 16.2 % — ABNORMAL HIGH (ref 11.5–15.5)
WBC: 4.3 K/uL (ref 4.0–10.5)
nRBC: 0 % (ref 0.0–0.2)

## 2024-08-26 LAB — SURGICAL PCR SCREEN
MRSA, PCR: NEGATIVE
Staphylococcus aureus: NEGATIVE

## 2024-08-26 NOTE — Progress Notes (Signed)
Lab. Results: Hemoglobin: 9.8 

## 2024-08-26 NOTE — Progress Notes (Addendum)
 For Anesthesia: PCP - Teresa Channel, MD  Cardiologist - N/A  Bowel Prep reminder:  Chest x-ray -  EKG - 08/26/24 Stress Test -  ECHO - 12/10/2015  Cardiac Cath -  Pacemaker/ICD device last checked: Pacemaker orders received: Device Rep notified:  Spinal Cord Stimulator:N/A  Sleep Study - N/A CPAP -   Fasting Blood Sugar - N/A Checks Blood Sugar _____ times a day Date and result of last Hgb A1c-  Last dose of GLP1 agonist- N/A GLP1 instructions: Hold 7 days prior to schedule (Hold 24 hours-daily)   Last dose of SGLT-2 inhibitors- N/A SGLT-2 instructions: Hold 72 hours prior to surgery  Blood Thinner Instructions:Plavix : On hold since: 08/22/24 Last Dose: Time last taken:  Aspirin Instructions: Last Dose: Time last taken:  Activity level: Unable to go up a flight of stairs due to hip pain.    Anesthesia review: Hx: HTN,Stroke,Thalassemia.  Patient denies shortness of breath, fever, cough and chest pain at PAT appointment   Patient verbalized understanding of instructions that were reviewed over the telephone.

## 2024-08-27 NOTE — Progress Notes (Signed)
 " Case: [id number] Date/Time: 08/28/24 1030   Procedure: ARTHROPLASTY, HIP, TOTAL, ANTERIOR APPROACH (Right: Hip)   Anesthesia type: Choice   Pre-op diagnosis: Right hip osteoarthritis   Location: [facility] ROOM 10 / [facility]   Surgeons: Melodi Lerner, MD       DISCUSSION: Stacy Levy is a 89 yo female with PMH of HTN, hx of CVA (2017) on Plavix , obesity, arthritis, chronic anemia, thalassemia minor  Surgery was originally planned for 10/29 but postponed due to anemia.   Seen by PCP on 03/28/24 for pre op clearance.  All issues stable at that visit. Cleared as moderate risk from cardiac/medical perspective (clearance scanned in media on 9/10):   Patient is set on undergoing THA for management, but has had concerns over what to expect. Further discussed patient's concerns and advised to further discuss with Dr. Hiram. Will hold on EKG and blood work today until patient knows when and where surgery will be scheduled at, if scheduled prior to AWV in 04/2024 she may schedule NV to have these done prior to surgery. Discussed need to discontinue clopidogrel  prior to surgery, and risks of surgery due to her age and h/o CVA discussed with patient and nieces today. Assuming labs return normal once taken, patient is cleared medically for surgery at this time. She does understand she is higher risk simply due to her age.   Hgb on pre op labs on 10/24 was 9.8 which was slightly decreased from prior which was 10.4 on 05/17/24 at PCP office Gwen). Patient was postponed.   She saw her PCP again on 10/29 who stated: While pre-op labs were only minimally decreased from baseline, I understand why concern was raised given patient's age. If this is her baseline, I would have been thinking of her proceeding with surgery, however, to avoid further issue, will repeat labs today and refer to hematologist for additional evaluation  Hgb on pre op labs again are 9.8. Discussed with Dr. Posey office and they are ok to  proceed.  LD Plavix : 1/1    VS: BP 128/65   Pulse 62   Temp 36.8 C (Oral)   Ht 5' 2 (1.575 m)   Wt 93 kg   SpO2 94%   BMI 37.49 kg/m   PROVIDERS: Teresa Channel, MD   LABS: Labs reviewed: Acceptable for surgery. (all labs ordered are listed, but only abnormal results are displayed)  Labs Reviewed  CBC - Abnormal; Notable for the following components:      Result Value   Hemoglobin 9.8 (*)    HCT 32.8 (*)    MCV 64.3 (*)    MCH 19.2 (*)    MCHC 29.9 (*)    RDW 16.2 (*)    All other components within normal limits  BASIC METABOLIC PANEL WITH GFR - Abnormal; Notable for the following components:   Glucose, Bld 112 (*)    BUN 28 (*)    All other components within normal limits  SURGICAL PCR SCREEN  TYPE AND SCREEN    EKG 08/26/24:  Sinus rhythm with 1st degree AV block Low voltage QRS     Echo 12/10/2015:   Study Conclusions   - Left ventricle: The cavity size was normal. There was mild   concentric hypertrophy. Systolic function was vigorous. The   estimated ejection fraction was in the range of 65% to 70%. Wall   motion was normal; there were no regional wall motion   abnormalities. Doppler parameters are consistent with abnormal  left ventricular relaxation (grade 1 diastolic dysfunction).  Past Medical History:  Diagnosis Date   Arthritis    Hypertension    Obesity    Stroke (HCC)    Thalassemia, beta (HCC)     Past Surgical History:  Procedure Laterality Date   ABDOMINAL HYSTERECTOMY     APPENDECTOMY     CATARACT EXTRACTION Bilateral    FOOT SURGERY Bilateral     MEDICATIONS:  atorvastatin  (LIPITOR) 40 MG tablet   clopidogrel  (PLAVIX ) 75 MG tablet   FIBER GUMMIES PO   Menthol -Camphor (TIGER BALM ARTHRITIS RUB EX)   MYRBETRIQ  50 MG TB24 tablet   OVER THE COUNTER MEDICATION   valsartan -hydrochlorothiazide  (DIOVAN -HCT) 320-25 MG tablet   No current facility-administered medications for this encounter.   Burnard CHRISTELLA Odis DEVONNA [facility]  Surgical Short Stay/Anesthesiology [facility] Phone [phone] 08/27/2024 8:47 AM       "

## 2024-08-27 NOTE — Anesthesia Preprocedure Evaluation (Signed)
 "                                  Anesthesia Evaluation  Patient identified by MRN, date of birth, ID band Patient awake    Reviewed: Allergy & Precautions, H&P , NPO status , Patient's Chart, lab work & pertinent test results  Airway Mallampati: II  TM Distance: >3 FB Neck ROM: Full    Dental no notable dental hx. (+) Edentulous Upper, Edentulous Lower,    Pulmonary neg pulmonary ROS   Pulmonary exam normal breath sounds clear to auscultation       Cardiovascular Exercise Tolerance: Good hypertension, Pt. on medications Normal cardiovascular exam Rhythm:Regular Rate:Normal  Echo 12/10/2015:   Study Conclusions   - Left ventricle: The cavity size was normal. There was mild   concentric hypertrophy. Systolic function was vigorous. The   estimated ejection fraction was in the range of 65% to 70%. Wall   motion was normal; there were no regional wall motion   abnormalities. Doppler parameters are consistent with abnormal   left ventricular relaxation (grade 1 diastolic dysfunction).    Neuro/Psych TIACVA, No Residual Symptoms  negative psych ROS   GI/Hepatic negative GI ROS, Neg liver ROS,,,  Endo/Other  negative endocrine ROS    Renal/GU negative Renal ROS  negative genitourinary   Musculoskeletal negative musculoskeletal ROS (+)    Abdominal   Peds negative pediatric ROS (+)  Hematology negative hematology ROS (+) Blood dyscrasia, anemia   Anesthesia Other Findings   Reproductive/Obstetrics negative OB ROS                              Anesthesia Physical Anesthesia Plan  ASA: 3  Anesthesia Plan: MAC and Spinal   Post-op Pain Management: Minimal or no pain anticipated   Induction: Intravenous  PONV Risk Score and Plan: 2 and Propofol  infusion  Airway Management Planned: Natural Airway and Simple Face Mask  Additional Equipment: None  Intra-op Plan:   Post-operative Plan:   Informed Consent: I have  reviewed the patients History and Physical, chart, labs and discussed the procedure including the risks, benefits and alternatives for the proposed anesthesia with the patient or authorized representative who has indicated his/her understanding and acceptance.       Plan Discussed with: Anesthesiologist  Anesthesia Plan Comments: (See PAT note from 1/5 DISCUSSION: Stacy Levy is a 89 yo female with PMH of HTN, hx of CVA (2017) on Plavix , obesity, arthritis, chronic anemia, thalassemia minor   Surgery was originally planned for 10/29 but postponed due to anemia.   Seen by PCP on 03/28/24 for pre op clearance.  All issues stable at that visit. Cleared as moderate risk from cardiac/medical perspective (clearance scanned in media on 9/10):   Patient is set on undergoing THA for management, but has had concerns over what to expect. Further discussed patient's concerns and advised to further discuss with Dr. Hiram. Will hold on EKG and blood work today until patient knows when and where surgery will be scheduled at, if scheduled prior to AWV in 04/2024 she may schedule NV to have these done prior to surgery. Discussed need to discontinue clopidogrel  prior to surgery, and risks of surgery due to her age and h/o CVA discussed with patient and nieces today. Assuming labs return normal once taken, patient is cleared medically for surgery at this time. She  Anesthesia Evaluation    Airway        Dental   Pulmonary           Cardiovascular hypertension,      Neuro/Psych    GI/Hepatic   Endo/Other    Renal/GU      Musculoskeletal   Abdominal   Peds  Hematology   Anesthesia Other Findings   Reproductive/Obstetrics                              Anesthesia Physical Anesthesia Plan  ASA:   Anesthesia Plan:    Post-op Pain Management:    Induction:   PONV Risk Score and Plan:   Airway Management Planned:   Additional Equipment:   Intra-op Plan:   Post-operative Plan:   Informed Consent:   Plan Discussed with:   Anesthesia Plan Comments: (See PAT note from 1/5)         Anesthesia Quick Evaluation  "

## 2024-08-28 ENCOUNTER — Other Ambulatory Visit: Payer: Self-pay

## 2024-08-28 ENCOUNTER — Observation Stay (HOSPITAL_COMMUNITY)

## 2024-08-28 ENCOUNTER — Encounter (HOSPITAL_COMMUNITY)
Admission: RE | Disposition: A | Discharge status: Home / Self Care | Payer: Self-pay | Source: Ambulatory Visit | Attending: Orthopedic Surgery

## 2024-08-28 ENCOUNTER — Encounter (HOSPITAL_COMMUNITY): Payer: Self-pay | Admitting: Orthopedic Surgery

## 2024-08-28 ENCOUNTER — Ambulatory Visit (HOSPITAL_COMMUNITY)

## 2024-08-28 ENCOUNTER — Ambulatory Visit (HOSPITAL_COMMUNITY): Payer: Self-pay | Admitting: Medical

## 2024-08-28 ENCOUNTER — Ambulatory Visit (HOSPITAL_BASED_OUTPATIENT_CLINIC_OR_DEPARTMENT_OTHER): Payer: Self-pay | Admitting: Anesthesiology

## 2024-08-28 ENCOUNTER — Observation Stay (HOSPITAL_COMMUNITY)
Admission: RE | Admit: 2024-08-28 | Discharge: 2024-08-29 | Disposition: A | Discharge status: Home / Self Care | Source: Ambulatory Visit | Attending: Orthopedic Surgery | Admitting: Orthopedic Surgery

## 2024-08-28 DIAGNOSIS — Z79899 Other long term (current) drug therapy: Secondary | ICD-10-CM | POA: Diagnosis not present

## 2024-08-28 DIAGNOSIS — Z8673 Personal history of transient ischemic attack (TIA), and cerebral infarction without residual deficits: Secondary | ICD-10-CM | POA: Diagnosis not present

## 2024-08-28 DIAGNOSIS — Z7902 Long term (current) use of antithrombotics/antiplatelets: Secondary | ICD-10-CM | POA: Insufficient documentation

## 2024-08-28 DIAGNOSIS — I10 Essential (primary) hypertension: Secondary | ICD-10-CM

## 2024-08-28 DIAGNOSIS — M1611 Unilateral primary osteoarthritis, right hip: Principal | ICD-10-CM | POA: Diagnosis present

## 2024-08-28 DIAGNOSIS — M169 Osteoarthritis of hip, unspecified: Secondary | ICD-10-CM | POA: Diagnosis present

## 2024-08-28 DIAGNOSIS — E669 Obesity, unspecified: Secondary | ICD-10-CM | POA: Diagnosis not present

## 2024-08-28 DIAGNOSIS — Z96641 Presence of right artificial hip joint: Secondary | ICD-10-CM

## 2024-08-28 DIAGNOSIS — Z01818 Encounter for other preprocedural examination: Principal | ICD-10-CM

## 2024-08-28 DIAGNOSIS — Z6837 Body mass index (BMI) 37.0-37.9, adult: Secondary | ICD-10-CM

## 2024-08-28 HISTORY — PX: TOTAL HIP ARTHROPLASTY: SHX124

## 2024-08-28 LAB — TYPE AND SCREEN
ABO/RH(D): A POS
Antibody Screen: NEGATIVE

## 2024-08-28 MED ORDER — PROPOFOL 10 MG/ML IV BOLUS
INTRAVENOUS | Status: DC | PRN
  Administered 2024-08-28 (×3): 10 mg via INTRAVENOUS

## 2024-08-28 MED ORDER — ONDANSETRON HCL 4 MG/2ML IJ SOLN: 4.0000 mg | Freq: Once | INTRAMUSCULAR | Status: DC | PRN

## 2024-08-28 MED ORDER — METHOCARBAMOL 500 MG PO TABS
500.0000 mg | ORAL_TABLET | Freq: Four times a day (QID) | ORAL | Status: DC | PRN
  Administered 2024-08-29: 500 mg via ORAL
  Filled 2024-08-28: qty 1

## 2024-08-28 MED ORDER — DOCUSATE SODIUM 100 MG PO CAPS
100.0000 mg | ORAL_CAPSULE | Freq: Two times a day (BID) | ORAL | Status: DC
  Administered 2024-08-28 – 2024-08-29 (×2): 100 mg via ORAL
  Filled 2024-08-28 (×2): qty 1

## 2024-08-28 MED ORDER — MORPHINE SULFATE (PF) 2 MG/ML IV SOLN: 0.5000 mg | INTRAVENOUS | Status: DC | PRN

## 2024-08-28 MED ORDER — OXYCODONE HCL 5 MG/5ML PO SOLN: 5.0000 mg | Freq: Once | ORAL | Status: DC | PRN

## 2024-08-28 MED ORDER — DEXAMETHASONE SOD PHOSPHATE PF 10 MG/ML IJ SOLN
INTRAMUSCULAR | Status: AC
  Filled 2024-08-28: qty 1

## 2024-08-28 MED ORDER — VALSARTAN-HYDROCHLOROTHIAZIDE 320-25 MG PO TABS: 1.0000 | ORAL_TABLET | Freq: Every day | ORAL | Status: DC

## 2024-08-28 MED ORDER — MAGNESIUM CITRATE PO SOLN: 1.0000 | Freq: Once | ORAL | Status: DC | PRN

## 2024-08-28 MED ORDER — BUPIVACAINE IN DEXTROSE 0.75-8.25 % IT SOLN
INTRATHECAL | Status: DC | PRN
  Administered 2024-08-28: 1.5 mL via INTRATHECAL

## 2024-08-28 MED ORDER — BUPIVACAINE-EPINEPHRINE (PF) 0.25% -1:200000 IJ SOLN
INTRAMUSCULAR | Status: AC
  Filled 2024-08-28: qty 30

## 2024-08-28 MED ORDER — ONDANSETRON HCL 4 MG PO TABS: 4.0000 mg | ORAL_TABLET | Freq: Four times a day (QID) | ORAL | Status: DC | PRN

## 2024-08-28 MED ORDER — ONDANSETRON HCL 4 MG/2ML IJ SOLN: 4.0000 mg | Freq: Four times a day (QID) | INTRAMUSCULAR | Status: DC | PRN

## 2024-08-28 MED ORDER — HYDROCHLOROTHIAZIDE 25 MG PO TABS
25.0000 mg | ORAL_TABLET | Freq: Every day | ORAL | Status: DC
  Filled 2024-08-28: qty 1

## 2024-08-28 MED ORDER — FENTANYL CITRATE (PF) 50 MCG/ML IJ SOSY
25.0000 ug | PREFILLED_SYRINGE | INTRAMUSCULAR | Status: DC | PRN
  Administered 2024-08-28 (×3): 25 ug via INTRAVENOUS

## 2024-08-28 MED ORDER — CHLORHEXIDINE GLUCONATE 0.12 % MT SOLN
15.0000 mL | Freq: Once | OROMUCOSAL | Status: AC
  Administered 2024-08-28: 15 mL via OROMUCOSAL

## 2024-08-28 MED ORDER — METOCLOPRAMIDE HCL 5 MG/ML IJ SOLN: 5.0000 mg | Freq: Three times a day (TID) | INTRAMUSCULAR | Status: DC | PRN

## 2024-08-28 MED ORDER — MENTHOL 3 MG MT LOZG: 1.0000 | LOZENGE | OROMUCOSAL | Status: DC | PRN

## 2024-08-28 MED ORDER — TRAMADOL HCL 50 MG PO TABS
50.0000 mg | ORAL_TABLET | Freq: Four times a day (QID) | ORAL | Status: DC
  Administered 2024-08-28 – 2024-08-29 (×3): 50 mg via ORAL
  Filled 2024-08-28 (×4): qty 1

## 2024-08-28 MED ORDER — ACETAMINOPHEN 10 MG/ML IV SOLN
1000.0000 mg | Freq: Four times a day (QID) | INTRAVENOUS | Status: DC
  Administered 2024-08-28: 1000 mg via INTRAVENOUS
  Filled 2024-08-28: qty 100

## 2024-08-28 MED ORDER — ATORVASTATIN CALCIUM 40 MG PO TABS: 40.0000 mg | ORAL_TABLET | Freq: Every day | ORAL | Status: DC

## 2024-08-28 MED ORDER — HYDROCODONE-ACETAMINOPHEN 7.5-325 MG PO TABS: 1.0000 | ORAL_TABLET | ORAL | Status: DC | PRN

## 2024-08-28 MED ORDER — CEFAZOLIN SODIUM-DEXTROSE 2-4 GM/100ML-% IV SOLN
2.0000 g | INTRAVENOUS | Status: AC
  Administered 2024-08-28: 2 g via INTRAVENOUS
  Filled 2024-08-28: qty 100

## 2024-08-28 MED ORDER — FENTANYL CITRATE (PF) 50 MCG/ML IJ SOSY
PREFILLED_SYRINGE | INTRAMUSCULAR | Status: AC
  Filled 2024-08-28: qty 1

## 2024-08-28 MED ORDER — POVIDONE-IODINE 10 % EX SWAB: 2.0000 | Freq: Once | CUTANEOUS | Status: DC

## 2024-08-28 MED ORDER — ONDANSETRON HCL 4 MG/2ML IJ SOLN
INTRAMUSCULAR | Status: DC | PRN
  Administered 2024-08-28: 4 mg via INTRAVENOUS

## 2024-08-28 MED ORDER — 0.9 % SODIUM CHLORIDE (POUR BTL) OPTIME
TOPICAL | Status: DC | PRN
  Administered 2024-08-28: 1000 mL

## 2024-08-28 MED ORDER — OXYCODONE HCL 5 MG PO TABS: 5.0000 mg | ORAL_TABLET | Freq: Once | ORAL | Status: DC | PRN

## 2024-08-28 MED ORDER — PHENYLEPHRINE HCL-NACL 20-0.9 MG/250ML-% IV SOLN
INTRAVENOUS | Status: DC | PRN
  Administered 2024-08-28: 50 ug/min via INTRAVENOUS

## 2024-08-28 MED ORDER — CLOPIDOGREL BISULFATE 75 MG PO TABS
75.0000 mg | ORAL_TABLET | Freq: Every day | ORAL | Status: DC
  Administered 2024-08-29: 75 mg via ORAL
  Filled 2024-08-28: qty 1

## 2024-08-28 MED ORDER — MEPERIDINE HCL 25 MG/ML IJ SOLN: 6.2500 mg | INTRAMUSCULAR | Status: DC | PRN

## 2024-08-28 MED ORDER — PHENOL 1.4 % MT LIQD: 1.0000 | OROMUCOSAL | Status: DC | PRN

## 2024-08-28 MED ORDER — ACETAMINOPHEN 500 MG PO TABS
500.0000 mg | ORAL_TABLET | Freq: Four times a day (QID) | ORAL | Status: AC
  Administered 2024-08-28 – 2024-08-29 (×4): 500 mg via ORAL
  Filled 2024-08-28 (×4): qty 1

## 2024-08-28 MED ORDER — DEXAMETHASONE SOD PHOSPHATE PF 10 MG/ML IJ SOLN
8.0000 mg | Freq: Once | INTRAMUSCULAR | Status: AC
  Administered 2024-08-28: 8 mg via INTRAVENOUS

## 2024-08-28 MED ORDER — LACTATED RINGERS IV SOLN: INTRAVENOUS | Status: DC

## 2024-08-28 MED ORDER — SODIUM CHLORIDE 0.9 % IV SOLN: INTRAVENOUS | Status: DC

## 2024-08-28 MED ORDER — ACETAMINOPHEN 325 MG PO TABS: 325.0000 mg | ORAL_TABLET | Freq: Four times a day (QID) | ORAL | Status: DC | PRN

## 2024-08-28 MED ORDER — CEFAZOLIN SODIUM-DEXTROSE 2-4 GM/100ML-% IV SOLN
2.0000 g | Freq: Four times a day (QID) | INTRAVENOUS | Status: AC
  Administered 2024-08-28 (×2): 2 g via INTRAVENOUS
  Filled 2024-08-28 (×2): qty 100

## 2024-08-28 MED ORDER — MIRABEGRON ER 25 MG PO TB24
50.0000 mg | ORAL_TABLET | Freq: Every day | ORAL | Status: DC
  Administered 2024-08-29: 50 mg via ORAL
  Filled 2024-08-28: qty 2

## 2024-08-28 MED ORDER — BUPIVACAINE-EPINEPHRINE (PF) 0.25% -1:200000 IJ SOLN
INTRAMUSCULAR | Status: DC | PRN
  Administered 2024-08-28: 30 mL

## 2024-08-28 MED ORDER — METHOCARBAMOL 1000 MG/10ML IJ SOLN: 500.0000 mg | Freq: Four times a day (QID) | INTRAMUSCULAR | Status: DC | PRN

## 2024-08-28 MED ORDER — PROPOFOL 500 MG/50ML IV EMUL
INTRAVENOUS | Status: DC | PRN
  Administered 2024-08-28: 80 ug/kg/min via INTRAVENOUS

## 2024-08-28 MED ORDER — HYDROCODONE-ACETAMINOPHEN 5-325 MG PO TABS: 1.0000 | ORAL_TABLET | ORAL | Status: DC | PRN

## 2024-08-28 MED ORDER — ACETAMINOPHEN 500 MG PO TABS: 1000.0000 mg | ORAL_TABLET | Freq: Once | ORAL | Status: DC

## 2024-08-28 MED ORDER — ONDANSETRON HCL 4 MG/2ML IJ SOLN
INTRAMUSCULAR | Status: AC
  Filled 2024-08-28: qty 2

## 2024-08-28 MED ORDER — TRANEXAMIC ACID-NACL 1000-0.7 MG/100ML-% IV SOLN
1000.0000 mg | INTRAVENOUS | Status: AC
  Administered 2024-08-28: 1000 mg via INTRAVENOUS
  Filled 2024-08-28: qty 100

## 2024-08-28 MED ORDER — METOCLOPRAMIDE HCL 5 MG PO TABS: 5.0000 mg | ORAL_TABLET | Freq: Three times a day (TID) | ORAL | Status: DC | PRN

## 2024-08-28 MED ORDER — IRBESARTAN 150 MG PO TABS
300.0000 mg | ORAL_TABLET | Freq: Every day | ORAL | Status: DC
  Filled 2024-08-28: qty 2

## 2024-08-28 MED ORDER — BISACODYL 10 MG RE SUPP: 10.0000 mg | Freq: Every day | RECTAL | Status: DC | PRN

## 2024-08-28 MED ORDER — POLYETHYLENE GLYCOL 3350 17 G PO PACK: 17.0000 g | PACK | Freq: Every day | ORAL | Status: DC | PRN

## 2024-08-28 MED ORDER — ORAL CARE MOUTH RINSE: 15.0000 mL | Freq: Once | OROMUCOSAL | Status: AC

## 2024-08-28 NOTE — Interval H&P Note (Signed)
 History and Physical Interval Note:  08/28/2024 8:20 AM  Stacy Levy  has presented today for surgery, with the diagnosis of Right hip osteoarthritis.  The various methods of treatment have been discussed with the patient and family. After consideration of risks, benefits and other options for treatment, the patient has consented to  Procedures: ARTHROPLASTY, HIP, TOTAL, ANTERIOR APPROACH (Right) as a surgical intervention.  The patient's history has been reviewed, patient examined, no change in status, stable for surgery.  I have reviewed the patient's chart and labs.  Questions were answered to the patient's satisfaction.     [name]

## 2024-08-28 NOTE — Evaluation (Signed)
 Physical Therapy Evaluation Patient Details Name: Stacy Levy MRN: [id number] DOB: 1932/01/05 Today's Date: 08/28/2024  History of Present Illness  89 yo female presents to therapy s/p R THA, anterior approach on 08/28/2024 due to failure of conservative measures. Pt PMH includes but is not limited to; CVA, HLD, HTN, arthritis and foot surgery.  Clinical Impression    Stacy Levy is a 89 y.o. female POD 0 s/p R THA. Patient reports mod I with mobility and some assist for ADLs/IADLs at baseline. Patient is now limited by functional impairments (see PT problem list below) and requires max A for bed mobility and mod A x 2 for sit to stand  and min A x 2 for SPT. Patient will benefit from continued skilled PT interventions to address impairments and progress towards PLOF. Acute PT will follow to progress mobility and stair training in preparation for safe discharge home with strong family support and HEP.      If plan is discharge home, recommend the following: Two people to help with walking and/or transfers;A lot of help with bathing/dressing/bathroom;Assistance with cooking/housework;Assist for transportation;Help with stairs or ramp for entrance   Can travel by private vehicle        Equipment Recommendations None recommended by PT  Recommendations for Other Services       Functional Status Assessment Patient has had a recent decline in their functional status and demonstrates the ability to make significant improvements in function in a reasonable and predictable amount of time.     Precautions / Restrictions Precautions Precautions: Fall Restrictions Weight Bearing Restrictions Per Provider Order: No      Mobility  Bed Mobility Overal bed mobility: Needs Assistance Bed Mobility: Supine to Sit     Supine to sit: Max assist, HOB elevated, Used rails (bed pad)     General bed mobility comments: cues, increased time and assist with niece providing some assist as well     Transfers Overall transfer level: Needs assistance Equipment used: Rolling walker (2 wheels) Transfers: Sit to/from Stand, Bed to chair/wheelchair/BSC Sit to Stand: Mod assist, Min assist, +2 physical assistance, +2 safety/equipment, From elevated surface Stand pivot transfers: Min assist, +2 physical assistance         General transfer comment: cues, increased time to power up and once in standing min A x 2 for safety, cues and RW management for SPT to recliner    Ambulation/Gait               General Gait Details: NT  Stairs            Wheelchair Mobility     Tilt Bed    Modified Rankin (Stroke Patients Only)       Balance Overall balance assessment: Needs assistance Sitting-balance support: Feet supported Sitting balance-Leahy Scale: Fair   Postural control: Posterior lean Standing balance support: During functional activity, Reliant on assistive device for balance, Bilateral upper extremity supported Standing balance-Leahy Scale: Poor Standing balance comment: min A and B UE support at RW, noted posterior lean with initial standing and cues for anterior weight shift and power up for extension posture                             Pertinent Vitals/Pain Pain Assessment Pain Assessment: 0-10 Pain Score: 7  Pain Location: R hip and LE Pain Descriptors / Indicators: Aching, Constant, Discomfort, Dull, Grimacing, Operative site guarding Pain Intervention(s): Limited activity within  patient's tolerance, Monitored during session, Premedicated before session, Repositioned, Ice applied (pt reports limited relief from tramadol  however medication had apparent changes in presentation and energy)    Home Living Family/patient expects to be discharged to:: Private residence Living Arrangements: Alone Available Help at Discharge: Family Type of Home: House Home Access: Stairs to enter;Ramped entrance Entrance Stairs-Rails: None Entrance  Stairs-Number of Steps: 1   Home Layout: One level Home Equipment: [employer]ant (2 wheels);Rollator (4 wheels);Cane - single point;BSC/3in1;Shower seat      Prior Function Prior Level of Function : Needs assist       Physical Assist : ADLs (physical);Mobility (physical) Mobility (physical): Gait;Stairs ADLs (physical): IADLs Mobility Comments: use of RW or rollator in home setting and transport wc in community ADLs Comments: Niece and family assists pt with IADLs and driving, indicating that pt has been limited due to R hip pain for about 6 months     Extremity/Trunk Assessment        Lower Extremity Assessment Lower Extremity Assessment: RLE deficits/detail RLE Deficits / Details: ankle DF/PF 4/5 RLE Sensation: WNL    Cervical / Trunk Assessment Cervical / Trunk Assessment: Kyphotic (slightly)  Communication   Communication Communication: No apparent difficulties    Cognition Arousal: Alert, Suspect due to medications Behavior During Therapy: Flat affect   PT - Cognitive impairments: No apparent impairments                         Following commands: Intact (increased time suspect medication)       Cueing Cueing Techniques: Verbal cues, Gestural cues, Tactile cues, Visual cues     General Comments      Exercises     Assessment/Plan    PT Assessment Patient needs continued PT services  PT Problem List Decreased strength;Decreased range of motion;Decreased activity tolerance;Decreased balance;Decreased mobility;Decreased coordination;Pain       PT Treatment Interventions DME instruction;Gait training;Stair training;Functional mobility training;Therapeutic activities;Therapeutic exercise;Balance training;Neuromuscular re-education;Patient/family education;Modalities    PT Goals (Current goals can be found in the Care Plan section)  Acute Rehab PT Goals Patient Stated Goal: to be able to increase IND PT Goal Formulation: With patient Time For  Goal Achievement: 09/11/24 Potential to Achieve Goals: Good    Frequency 7X/week     Co-evaluation               AM-PAC PT 6 Clicks Mobility  Outcome Measure Help needed turning from your back to your side while in a flat bed without using bedrails?: A Little Help needed moving from lying on your back to sitting on the side of a flat bed without using bedrails?: A Lot Help needed moving to and from a bed to a chair (including a wheelchair)?: A Lot Help needed standing up from a chair using your arms (e.g., wheelchair or bedside chair)?: A Lot Help needed to walk in hospital room?: Total Help needed climbing 3-5 steps with a railing? : Total 6 Click Score: 11    End of Session Equipment Utilized During Treatment: Gait belt Activity Tolerance: Patient limited by fatigue Patient left: in chair;with call bell/phone within reach;with family/visitor present Nurse Communication: Mobility status PT Visit Diagnosis: Unsteadiness on feet (R26.81);Other abnormalities of gait and mobility (R26.89);Muscle weakness (generalized) (M62.81);Difficulty in walking, not elsewhere classified (R26.2);Pain Pain - Right/Left: Right Pain - part of body: Leg;Hip    Time: 8180-8161 PT Time Calculation (min) (ACUTE ONLY): 19 min   Charges:   PT Evaluation $  PT Eval Low Complexity: 1 Low   PT General Charges $$ ACUTE PT VISIT: 1 Visit         [place], PT Acute Rehab   [place] VEAR Drone 08/28/2024, 6:54 PM

## 2024-08-28 NOTE — Plan of Care (Signed)
  Problem: Education: Goal: Knowledge of General Education information will improve Description: Including pain rating scale, medication(s)/side effects and non-pharmacologic comfort measures Outcome: Progressing   Problem: Activity: Goal: Risk for activity intolerance will decrease Outcome: Progressing   Problem: Pain Managment: Goal: General experience of comfort will improve and/or be controlled Outcome: Progressing

## 2024-08-28 NOTE — Anesthesia Postprocedure Evaluation (Signed)
"   Anesthesia Post Note  Patient: Stacy Levy  Procedure(s) Performed: ARTHROPLASTY, HIP, TOTAL, ANTERIOR APPROACH (Right: Hip)     Patient location during evaluation: PACU Anesthesia Type: MAC Level of consciousness: awake and alert Pain management: pain level controlled Vital Signs Assessment: post-procedure vital signs reviewed and stable Respiratory status: spontaneous breathing, nonlabored ventilation, respiratory function stable and patient connected to nasal cannula oxygen Cardiovascular status: stable and blood pressure returned to baseline Postop Assessment: no apparent nausea or vomiting Anesthetic complications: no   No notable events documented.  Last Vitals:  Vitals:   08/28/24 1240 08/28/24 1245  BP: 122/61 128/62  Pulse: 66 60  Resp: (!) 23 15  Temp: (!) 36.4 C   SpO2: 99% 100%    Last Pain:  Vitals:   08/28/24 1245  TempSrc:   PainSc: Asleep                 [name]      "

## 2024-08-28 NOTE — Anesthesia Procedure Notes (Signed)
 Spinal  Patient location during procedure: OR Start time: 08/28/2024 10:30 AM End time: 08/28/2024 10:32 AM Reason for block: surgical anesthesia  Staffing Performed: resident/CRNA  Authorized by: Mallory Manus, MD   Performed by: Carleton Garnette SAUNDERS, CRNA  Preanesthetic Checklist Completed: patient identified, IV checked, site marked, risks and benefits discussed, surgical consent, monitors and equipment checked, pre-op evaluation and timeout performed Spinal Block Patient position: sitting Prep: DuraPrep Patient monitoring: cardiac monitor, heart rate, continuous pulse ox and blood pressure Approach: midline Location: L3-4 Injection technique: single-shot Needle Needle type: Pencan  Needle gauge: 24 G Needle length: 10 cm Needle insertion depth (cm): 8 Assessment Sensory level: T6 Events: CSF return  Additional Notes  Timeout performed. Patient in sitting position. L3-4 identified. Cleansed with Duraprep. SAB without difficulty. To supine position.

## 2024-08-28 NOTE — Transfer of Care (Signed)
 Immediate Anesthesia Transfer of Care Note  Patient: Stacy Levy  Procedure(s) Performed: ARTHROPLASTY, HIP, TOTAL, ANTERIOR APPROACH (Right: Hip)  Patient Location: PACU  Anesthesia Type:Spinal  Level of Consciousness: awake  Airway & Oxygen Therapy: Patient Spontanous Breathing and Patient connected to face mask  Post-op Assessment: Report given to RN and Post -op Vital signs reviewed and stable  Post vital signs: Reviewed and stable  Last Vitals:  Vitals Value Taken Time  BP    Temp    Pulse 65 08/28/24 12:08  Resp 20 08/28/24 12:08  SpO2 98 % 08/28/24 12:08  Vitals shown include unfiled device data.  Last Pain:  Vitals:   08/28/24 0805  TempSrc: Oral  PainSc: 9       Patients Stated Pain Goal: 4 (08/28/24 0805)  Complications: No notable events documented.

## 2024-08-28 NOTE — Op Note (Signed)
 "     OPERATIVE REPORT- TOTAL HIP ARTHROPLASTY   PREOPERATIVE DIAGNOSIS: Osteoarthritis of the Right hip.   POSTOPERATIVE DIAGNOSIS: Osteoarthritis of the Right  hip.   PROCEDURE: Right total hip arthroplasty, anterior approach.   SURGEON: Dempsey Moan, MD   ASSISTANT: Roxie Mess, PA-C  ANESTHESIA:  Spinal  ESTIMATED BLOOD LOSS:-350 mL    DRAINS: None  COMPLICATIONS: None   CONDITION: PACU - hemodynamically stable.   BRIEF CLINICAL NOTE: Stacy Levy is a 89 y.o. female who has advanced end-  stage arthritis of their Right  hip with progressively worsening pain and  dysfunction.The patient has failed nonoperative management and presents for  total hip arthroplasty.   PROCEDURE IN DETAIL: After successful administration of spinal  anesthetic, the traction boots for the [facility] bed were placed on both  feet and the patient was placed onto the [facility] bed, boots placed into the leg  holders. The Right hip was then isolated from the perineum with plastic  drapes and prepped and draped in the usual sterile fashion. ASIS and  greater trochanter were marked and a oblique incision was made, starting  at about 1 cm lateral and 2 cm distal to the ASIS and coursing towards  the anterior cortex of the femur. The skin was cut with a 10 blade  through subcutaneous tissue to the level of the fascia overlying the  tensor fascia lata muscle. The fascia was then incised in line with the  incision at the junction of the anterior third and posterior 2/3rd. The  muscle was teased off the fascia and then the interval between the TFL  and the rectus was developed. The Hohmann retractor was then placed at  the top of the femoral neck over the capsule. The vessels overlying the  capsule were cauterized and the fat on top of the capsule was removed.  A Hohmann retractor was then placed anterior underneath the rectus  femoris to give exposure to the entire anterior capsule. A T-shaped   capsulotomy was performed. The edges were tagged and the femoral head  was identified.       Osteophytes are removed off the superior acetabulum.  The femoral neck was then cut in situ with an oscillating saw. Traction  was then applied to the left lower extremity utilizing the [facility]  traction. The femoral head was then removed. Retractors were placed  around the acetabulum and then circumferential removal of the labrum was  performed. Osteophytes were also removed. Reaming starts at 47 mm to  medialize and  Increased in 2 mm increments to 49 mm. We reamed in  approximately 40 degrees of abduction, 20 degrees anteversion. A 50 mm  pinnacle acetabular shell was then impacted in anatomic position under  fluoroscopic guidance with excellent purchase. We did not need to place  any additional dome screws. A 32 mm neutral + 4 Altrx liner was then  placed into the acetabular shell.       The femoral lift was then placed along the lateral aspect of the femur  just distal to the vastus ridge. The leg was  externally rotated and capsule  was stripped off the inferior aspect of the femoral neck down to the  level of the lesser trochanter, this was done with electrocautery. The femur was lifted after this was performed. The  leg was then placed in an extended and adducted position essentially delivering the femur. We also removed the capsule superiorly and the piriformis from the piriformis fossa  to gain excellent exposure of the  proximal femur. Rongeur was used to remove some cancellous bone to get  into the lateral portion of the proximal femur for placement of the  initial starter reamer. The starter broaches was placed  the starter broach  and was shown to go down the center of the canal. Broaching  with the Actis system was then performed starting at size 0  coursing  Up to size 5. A size 5 had excellent torsional and rotational  and axial stability. The trial standard offset neck was then placed   with a 32 + 1 trial head. The hip was then reduced. We confirmed that  the stem was in the canal both on AP and lateral x-rays. It also has excellent sizing. The hip was reduced with outstanding stability through full extension and full external rotation.. AP pelvis was taken and the leg lengths were measured and found to be equal. Hip was then dislocated again and the femoral head and neck removed. The  femoral broach was removed. Size 5 Actis stem with a standard offset  neck was then impacted into the femur following native anteversion. Has  excellent purchase in the canal. Excellent torsional and rotational and  axial stability. It is confirmed to be in the canal on AP and lateral  fluoroscopic views. The 32 + 1 metal head was placed and the hip  reduced with outstanding stability. Again AP pelvis was taken and it  confirmed that the leg lengths were equal. The wound was then copiously  irrigated with saline solution and the capsule reattached and repaired  with Ethibond suture. 30 ml of .25% Bupivicaine was  injected into the capsule and into the edge of the tensor fascia lata as well as subcutaneous tissue. The fascia overlying the tensor fascia lata was then closed with a running #1 V-Loc. Subcu was closed with interrupted 2-0 Vicryl and subcuticular running 4-0 Monocryl. Incision was cleaned  and dried. Steri-Strips and a bulky sterile dressing applied. The patient was awakened and transported to  recovery in stable condition.        Please note that a surgical assistant was a medical necessity for this procedure to perform it in a safe and expeditious manner. Assistant was necessary to provide appropriate retraction of vital neurovascular structures and to prevent femoral fracture and allow for anatomic placement of the prosthesis.  Dempsey Moan, M.D.    "

## 2024-08-28 NOTE — Care Plan (Signed)
 Ortho Bundle Case Management Note  Patient Details  Name: Stacy Levy MRN: [id number] Date of Birth: 31-May-1932  RT THA on 08/28/24  DCP: Home with niece  DME: No needs, has RW  PT: HEP                   DME Arranged:  N/A DME Agency:  NA  HH Arranged:    HH Agency:     Additional Comments: Please contact me with any questions of if this plan should need to change.  Burnard Dross, Case Manager [facility] [phone] Ext. [phone]   08/28/2024, 9:12 AM

## 2024-08-28 NOTE — Discharge Instructions (Signed)
°Frank Aluisio, MD °Total Joint Specialist °[facility] °[address] °[place], [place] [address] °[phone] ° °ANTERIOR APPROACH TOTAL HIP REPLACEMENT POSTOPERATIVE DIRECTIONS ° ° ° ° °Hip Rehabilitation, Guidelines Following Surgery  °The results of a hip operation are greatly improved after range of motion and muscle strengthening exercises. Follow all safety measures which are given to protect your hip. If any of these exercises cause increased pain or swelling in your joint, decrease the amount until you are comfortable again. Then slowly increase the exercises. Call your caregiver if you have problems or questions.  ° °HOME CARE INSTRUCTIONS  °Remove items at home which could result in a fall. This includes throw rugs or furniture in walking pathways.  °ICE to the affected hip as frequently as 20-30 minutes an hour and then as needed for pain and swelling. Continue to use ice on the hip for pain and swelling from surgery. You may notice swelling that will progress down to the foot and ankle. This is normal after surgery. Elevate the leg when you are not up walking on it.   °Continue to use the breathing machine which will help keep your temperature down.  It is common for your temperature to cycle up and down following surgery, especially at night when you are not up moving around and exerting yourself.  The breathing machine keeps your lungs expanded and your temperature down. ° °DIET °You may resume your previous home diet once your are discharged from the hospital. ° °DRESSING / WOUND CARE / SHOWERING °You have an adhesive waterproof bandage over the incision. Leave this in place until your first follow-up appointment. Once you remove this you will not need to place another bandage.  °You may begin showering 3 days following surgery, but do not submerge the incision under water. ° °ACTIVITY °For the first 3-5 days, it is important to rest and keep the operative leg elevated.  You should, as a general rule, rest for 50 minutes and walk/stretch for 10 minutes per hour. After 5 days, you may slowly increase activity as tolerated.  °Perform the exercises you were provided twice a day for about 15-20 minutes each session. Begin these 2 days following surgery. °Walk with your walker as instructed. Use the walker until you are comfortable transitioning to a cane. Walk with the cane in the opposite hand of the operative leg. You may discontinue the cane once you are comfortable and walking steadily. °Avoid periods of inactivity such as sitting longer than an hour when not asleep. This helps prevent blood clots.  °Do not drive a car for 6 weeks or until released by your surgeon.  °Do not drive while taking narcotics. ° °TED HOSE STOCKINGS °Wear the elastic stockings on both legs for three weeks following surgery during the day. You may remove them at night while sleeping. ° °WEIGHT BEARING °Weight bearing as tolerated with assist device (walker, cane, etc) as directed, use it as long as suggested by your surgeon or therapist, typically at least 4-6 weeks. ° °POSTOPERATIVE CONSTIPATION PROTOCOL °Constipation - defined medically as fewer than three stools per week and severe constipation as less than one stool per week. ° °One of the most common issues patients have following surgery is constipation.  Even if you have a regular bowel pattern at home, your normal regimen is likely to be disrupted due to multiple reasons following surgery.  Combination of anesthesia, postoperative narcotics, change in appetite and fluid intake all can affect your bowels.    In order to avoid complications following surgery, here are some recommendations in order to help you during your recovery period. ° °Colace (docusate) - Pick up an over-the-counter form of Colace or another stool softener and take twice a day as long as you are requiring postoperative pain medications.  Take with a full glass of water daily.  If  you experience loose stools or diarrhea, hold the colace until you stool forms back up.  If your symptoms do not get better within 1 week or if they get worse, check with your doctor. °Dulcolax (bisacodyl) - Pick up over-the-counter and take as directed by the product packaging as needed to assist with the movement of your bowels.  Take with a full glass of water.  Use this product as needed if not relieved by Colace only.  °MiraLax (polyethylene glycol) - Pick up over-the-counter to have on hand.  MiraLax is a solution that will increase the amount of water in your bowels to assist with bowel movements.  Take as directed and can mix with a glass of water, juice, soda, coffee, or tea.  Take if you go more than two days without a movement.Do not use MiraLax more than once per day. Call your doctor if you are still constipated or irregular after using this medication for 7 days in a row. ° °If you continue to have problems with postoperative constipation, please contact the office for further assistance and recommendations.  If you experience "the worst abdominal pain ever" or develop nausea or vomiting, please contact the office immediatly for further recommendations for treatment. ° °ITCHING ° If you experience itching with your medications, try taking only a single pain pill, or even half a pain pill at a time.  You can also use Benadryl over the counter for itching or also to help with sleep.  ° °MEDICATIONS °See your medication summary on the “After Visit Summary” that the nursing staff will review with you prior to discharge.  You may have some home medications which will be placed on hold until you complete the course of blood thinner medication.  It is important for you to complete the blood thinner medication as prescribed by your surgeon.  Continue your approved medications as instructed at time of discharge. ° °PRECAUTIONS °If you experience chest pain or shortness of breath - call 911 immediately for  transfer to the hospital emergency department.  °If you develop a fever greater that 101 F, purulent drainage from wound, increased redness or drainage from wound, foul odor from the wound/dressing, or calf pain - CONTACT YOUR SURGEON.   °                                                °FOLLOW-UP APPOINTMENTS °Make sure you keep all of your appointments after your operation with your surgeon and caregivers. You should call the office at the above phone number and make an appointment for approximately two weeks after the date of your surgery or on the date instructed by your surgeon outlined in the "After Visit Summary". ° °RANGE OF MOTION AND STRENGTHENING EXERCISES  °These exercises are designed to help you keep full movement of your hip joint. Follow your caregiver's or physical therapist's instructions. Perform all exercises about fifteen times, three times per day or as directed. Exercise both hips, even if you have had only   one joint replacement. These exercises can be done on a training (exercise) mat, on the floor, on a table or on a bed. Use whatever works the best and is most comfortable for you. Use music or television while you are exercising so that the exercises are a pleasant break in your day. This will make your life better with the exercises acting as a break in routine you can look forward to.  °Lying on your back, slowly slide your foot toward your buttocks, raising your knee up off the floor. Then slowly slide your foot back down until your leg is straight again.  °Lying on your back spread your legs as far apart as you can without causing discomfort.  °Lying on your side, raise your upper leg and foot straight up from the floor as far as is comfortable. Slowly lower the leg and repeat.  °Lying on your back, tighten up the muscle in the front of your thigh (quadriceps muscles). You can do this by keeping your leg straight and trying to raise your heel off the floor. This helps strengthen the  largest muscle supporting your knee.  °Lying on your back, tighten up the muscles of your buttocks both with the legs straight and with the knee bent at a comfortable angle while keeping your heel on the floor.  ° °POST-OPERATIVE OPIOID TAPER INSTRUCTIONS: °It is important to wean off of your opioid medication as soon as possible. If you do not need pain medication after your surgery it is ok to stop day one. °Opioids include: °Codeine, Hydrocodone(Norco, Vicodin), Oxycodone(Percocet, oxycontin) and hydromorphone amongst others.  °Long term and even short term use of opiods can cause: °Increased pain response °Dependence °Constipation °Depression °Respiratory depression °And more.  °Withdrawal symptoms can include °Flu like symptoms °Nausea, vomiting °And more °Techniques to manage these symptoms °Hydrate well °Eat regular healthy meals °Stay active °Use relaxation techniques(deep breathing, meditating, yoga) °Do Not substitute Alcohol to help with tapering °If you have been on opioids for less than two weeks and do not have pain than it is ok to stop all together.  °Plan to wean off of opioids °This plan should start within one week post op of your joint replacement. °Maintain the same interval or time between taking each dose and first decrease the dose.  °Cut the total daily intake of opioids by one tablet each day °Next start to increase the time between doses. °The last dose that should be eliminated is the evening dose.  ° °IF YOU ARE TRANSFERRED TO A SKILLED REHAB FACILITY °If the patient is transferred to a skilled rehab facility following release from the hospital, a list of the current medications will be sent to the facility for the patient to continue.  When discharged from the skilled rehab facility, please have the facility set up the patient's Home Health Physical Therapy prior to being released. Also, the skilled facility will be responsible for providing the patient with their medications at time of  release from the facility to include their pain medication, the muscle relaxants, and their blood thinner medication. If the patient is still at the rehab facility at time of the two week follow up appointment, the skilled rehab facility will also need to assist the patient in arranging follow up appointment in our office and any transportation needs. ° °MAKE SURE YOU:  °Understand these instructions.  °Get help right away if you are not doing well or get worse.  ° ° °DENTAL ANTIBIOTICS: ° °In most   cases prophylactic antibiotics for Dental procdeures after total joint surgery are not necessary. ° °Exceptions are as follows: ° °1. History of prior total joint infection ° °2. Severely immunocompromised (Organ Transplant, cancer chemotherapy, Rheumatoid biologic °meds such as Humera) ° °3. Poorly controlled diabetes (A1C &gt; 8.0, blood glucose over 200) ° °If you have one of these conditions, contact your surgeon for an antibiotic prescription, prior to your °dental procedure.  ° ° °Pick up stool softner and laxative for home use following surgery while on pain medications. °Do not submerge incision under water. °Please use good hand washing techniques while changing dressing each day. °May shower starting three days after surgery. °Please use a clean towel to pat the incision dry following showers. °Continue to use ice for pain and swelling after surgery. °Do not use any lotions or creams on the incision until instructed by your surgeon. ° °

## 2024-08-29 ENCOUNTER — Other Ambulatory Visit (HOSPITAL_COMMUNITY): Payer: Self-pay

## 2024-08-29 ENCOUNTER — Encounter (HOSPITAL_COMMUNITY): Payer: Self-pay | Admitting: Orthopedic Surgery

## 2024-08-29 DIAGNOSIS — M1611 Unilateral primary osteoarthritis, right hip: Secondary | ICD-10-CM | POA: Diagnosis not present

## 2024-08-29 LAB — BASIC METABOLIC PANEL WITH GFR
Anion gap: 7 (ref 5–15)
BUN: 21 mg/dL (ref 8–23)
CO2: 28 mmol/L (ref 22–32)
Calcium: 8.7 mg/dL — ABNORMAL LOW (ref 8.9–10.3)
Chloride: 102 mmol/L (ref 98–111)
Creatinine, Ser: 0.78 mg/dL (ref 0.44–1.00)
GFR, Estimated: >60 mL/min
Glucose, Bld: 127 mg/dL — ABNORMAL HIGH (ref 70–99)
Potassium: 4.4 mmol/L (ref 3.5–5.1)
Sodium: 136 mmol/L (ref 135–145)

## 2024-08-29 LAB — CBC
HCT: 28.2 % — ABNORMAL LOW (ref 36.0–46.0)
Hemoglobin: 8.6 g/dL — ABNORMAL LOW (ref 12.0–15.0)
MCH: 19.4 pg — ABNORMAL LOW (ref 26.0–34.0)
MCHC: 30.5 g/dL (ref 30.0–36.0)
MCV: 63.5 fL — ABNORMAL LOW (ref 80.0–100.0)
Platelets: 231 K/uL (ref 150–400)
RBC: 4.44 MIL/uL (ref 3.87–5.11)
RDW: 16.1 % — ABNORMAL HIGH (ref 11.5–15.5)
WBC: 10.1 K/uL (ref 4.0–10.5)
nRBC: 0 % (ref 0.0–0.2)

## 2024-08-29 MED ORDER — HYDROCODONE-ACETAMINOPHEN 5-325 MG PO TABS
1.0000 | ORAL_TABLET | ORAL | 0 refills | Status: AC | PRN
  Filled 2024-08-29: qty 42, 7d supply, fill #0

## 2024-08-29 MED ORDER — METHOCARBAMOL 500 MG PO TABS
500.0000 mg | ORAL_TABLET | Freq: Four times a day (QID) | ORAL | 0 refills | Status: AC | PRN
  Filled 2024-08-29: qty 40, 10d supply, fill #0

## 2024-08-29 MED ORDER — ONDANSETRON HCL 4 MG PO TABS
4.0000 mg | ORAL_TABLET | Freq: Four times a day (QID) | ORAL | 0 refills | Status: AC | PRN
  Filled 2024-08-29: qty 20, 5d supply, fill #0

## 2024-08-29 MED ORDER — TRAMADOL HCL 50 MG PO TABS
50.0000 mg | ORAL_TABLET | Freq: Four times a day (QID) | ORAL | 0 refills | Status: AC | PRN
  Filled 2024-08-29: qty 40, 10d supply, fill #0

## 2024-08-29 NOTE — Progress Notes (Signed)
 Physical Therapy Treatment Patient Details Name: Stacy Levy MRN: [id number] DOB: [birth year] Today's Date: 08/29/2024   History of Present Illness 89 yo female presents to therapy s/p R THA, anterior approach on 08/28/2024 due to failure of conservative measures. Pt PMH includes but is not limited to; CVA, HLD, HTN, arthritis and foot surgery.    PT Comments  Pt assisted with ambulating short distance.  Family also held gait belt as they plan to assist pt upon d/c.  Pt did attempt one step (per family request) however not able to perform.  Family plans to continue bumping pt on one step at home as performed previous to admission.     If plan is discharge home, recommend the following: A lot of help with bathing/dressing/bathroom;Assistance with cooking/housework;Assist for transportation;Help with stairs or ramp for entrance;A lot of help with walking and/or transfers   Can travel by private vehicle        Equipment Recommendations  None recommended by PT    Recommendations for Other Services       Precautions / Restrictions Precautions Precautions: Fall     Mobility  Bed Mobility               General bed mobility comments: pt in recliner    Transfers Overall transfer level: Needs assistance Equipment used: Rolling walker (2 wheels) Transfers: Sit to/from Stand Sit to Stand: Min assist, +2 safety/equipment           General transfer comment: multimodal cues for positioning and technique, min assist to rise and stabilize, cues for use of UEs through RW upon standing    Ambulation/Gait Ambulation/Gait assistance: Min assist, +2 safety/equipment Gait Distance (Feet): 20 Feet Assistive device: Rolling walker (2 wheels) Gait Pattern/deviations: Step-to pattern, Decreased stance time - right, Antalgic Gait velocity: decr     General Gait Details: verbal cues for sequence, RW positioning, posture, distance to tolerance   Stairs Stairs: Yes       General  stair comments: attempted one step with RW both forwards and backwards however pt unable to take step up; has been doing bump in w/c with family assist prior to surgery however   Wheelchair Mobility     Tilt Bed    Modified Rankin (Stroke Patients Only)       Balance                                            Communication Communication Communication: No apparent difficulties  Cognition Arousal: Alert Behavior During Therapy: WFL for tasks assessed/performed   PT - Cognitive impairments: No apparent impairments                         Following commands: Intact      Cueing Cueing Techniques: Verbal cues, Tactile cues  Exercises      General Comments        Pertinent Vitals/Pain Pain Assessment Pain Assessment: 0-10 Pain Score: 5  Pain Location: R hip and LE Pain Descriptors / Indicators: Aching, Constant, Discomfort, Dull, Grimacing Pain Intervention(s): Repositioned, Monitored during session, Ice applied    Home Living                          Prior Function            PT Goals (  current goals can now be found in the care plan section) Progress towards PT goals: Progressing toward goals    Frequency    7X/week      PT Plan      Co-evaluation              AM-PAC PT 6 Clicks Mobility   Outcome Measure  Help needed turning from your back to your side while in a flat bed without using bedrails?: A Little Help needed moving from lying on your back to sitting on the side of a flat bed without using bedrails?: A Little Help needed moving to and from a bed to a chair (including a wheelchair)?: A Little Help needed standing up from a chair using your arms (e.g., wheelchair or bedside chair)?: A Little Help needed to walk in hospital room?: A Little Help needed climbing 3-5 steps with a railing? : Total 6 Click Score: 16    End of Session Equipment Utilized During Treatment: Gait belt Activity  Tolerance: Patient limited by fatigue Patient left: in chair;with call bell/phone within reach;with family/visitor present   PT Visit Diagnosis: Difficulty in walking, not elsewhere classified (R26.2);Muscle weakness (generalized) (M62.81)     Time: 8982-8962 PT Time Calculation (min) (ACUTE ONLY): 20 min  Charges:    $Gait Training: 8-22 mins PT General Charges $$ ACUTE PT VISIT: 1 Visit                     Tari PT, DPT Physical Therapist Acute Rehabilitation Services Office: [phone]  Kati L Payson 08/29/2024, 1:30 PM

## 2024-08-29 NOTE — Progress Notes (Signed)
 Physical Therapy Treatment Patient Details Name: Stacy Levy MRN: [id number] DOB: April 18, 1932 Today's Date: 08/29/2024   History of Present Illness 89 yo female presents to therapy s/p R THA, anterior approach on 08/28/2024 due to failure of conservative measures. Pt PMH includes but is not limited to; CVA, HLD, HTN, arthritis and foot surgery.    PT Comments  Pt requesting assist to bathroom.  Pt provided with cues for positioning and technique with family (will be caregivers) observing.  Pt returned to bed end of session very fatigued.  Pt and family anticipate d/c home today.  Provided HEP handout and reviewed with family.  Pt and family had no further questions.     If plan is discharge home, recommend the following: A lot of help with bathing/dressing/bathroom;Assistance with cooking/housework;Assist for transportation;Help with stairs or ramp for entrance;A lot of help with walking and/or transfers   Can travel by private vehicle        Equipment Recommendations  None recommended by PT    Recommendations for Other Services       Precautions / Restrictions Precautions Precautions: Fall Restrictions Weight Bearing Restrictions Per Provider Order: No     Mobility  Bed Mobility Overal bed mobility: Needs Assistance Bed Mobility: Supine to Sit, Sit to Supine     Supine to sit: Max assist, +2 for physical assistance, HOB elevated Sit to supine: Min assist   General bed mobility comments: multimodal cues for self assisting as able, required more assist for OOB, increased time and effort    Transfers Overall transfer level: Needs assistance Equipment used: Rolling walker (2 wheels) Transfers: Sit to/from Stand Sit to Stand: Min assist           General transfer comment: multimodal cues for positioning and technique, min assist to rise and stabilize, cues for use of UEs through RW upon standing    Ambulation/Gait Ambulation/Gait assistance: Min assist Gait  Distance (Feet): 20 Feet Assistive device: Rolling walker (2 wheels) Gait Pattern/deviations: Step-to pattern, Decreased stance time - right, Antalgic Gait velocity: decr     General Gait Details: verbal cues for sequence, RW positioning, posture, approx 20 ft total to/from bathroom   Stairs Stairs: Yes       General stair comments: attempted one step with RW both forwards and backwards however pt unable to take step up; has been doing bump in w/c with family assist prior to surgery however   Wheelchair Mobility     Tilt Bed    Modified Rankin (Stroke Patients Only)       Balance                                            Communication Communication Communication: No apparent difficulties  Cognition Arousal: Alert Behavior During Therapy: WFL for tasks assessed/performed   PT - Cognitive impairments: No apparent impairments                         Following commands: Intact      Cueing Cueing Techniques: Verbal cues, Tactile cues  Exercises      General Comments        Pertinent Vitals/Pain Pain Assessment Pain Assessment: 0-10 Pain Score: 7  Pain Location: R hip and LE Pain Descriptors / Indicators: Aching, Constant, Discomfort, Dull, Grimacing Pain Intervention(s): Monitored during session, Repositioned  Home Living                          Prior Function            PT Goals (current goals can now be found in the care plan section) Progress towards PT goals: Progressing toward goals    Frequency    7X/week      PT Plan      Co-evaluation              AM-PAC PT 6 Clicks Mobility   Outcome Measure  Help needed turning from your back to your side while in a flat bed without using bedrails?: A Lot Help needed moving from lying on your back to sitting on the side of a flat bed without using bedrails?: A Lot Help needed moving to and from a bed to a chair (including a wheelchair)?: A  Little Help needed standing up from a chair using your arms (e.g., wheelchair or bedside chair)?: A Little Help needed to walk in hospital room?: A Little Help needed climbing 3-5 steps with a railing? : Total 6 Click Score: 14    End of Session Equipment Utilized During Treatment: Gait belt Activity Tolerance: Patient limited by fatigue Patient left: with call bell/phone within reach;with family/visitor present;in bed Nurse Communication: Mobility status PT Visit Diagnosis: Difficulty in walking, not elsewhere classified (R26.2);Muscle weakness (generalized) (M62.81)     Time: 8494-8464 PT Time Calculation (min) (ACUTE ONLY): 30 min  Charges:    $Gait Training: 23-37 mins PT General Charges $$ ACUTE PT VISIT: 1 Visit                     Tari PT, DPT Physical Therapist Acute Rehabilitation Services Office: [phone]    Tari CROME Payson 08/29/2024, 4:28 PM

## 2024-08-29 NOTE — Progress Notes (Signed)
 Discharge instructions given to patient and family questions asked and answered.

## 2024-08-29 NOTE — Progress Notes (Signed)
 Discharge medications delivered to patient at the bedside in a secure bag.

## 2024-08-29 NOTE — Progress Notes (Signed)
" ° °  Subjective: 1 Day Post-Op Procedures (LRB): ARTHROPLASTY, HIP, TOTAL, ANTERIOR APPROACH (Right) Patient reports pain as mild.   Patient seen in rounds by Dr. Melodi. Patient is well, and has had no acute complaints or problems. Denies chest pain or SOB. No issues overnight. Foley catheter removed this AM. We will continue therapy today.  Objective: Vital signs in last 24 hours: Temp:  [97.5 F (36.4 C)-99.1 F (37.3 C)] 98.5 F (36.9 C) (01/08 0528) Pulse Rate:  [58-88] 70 (01/08 0528) Resp:  [14-23] 18 (01/08 0528) BP: (110-138)/(54-103) 115/56 (01/08 0528) SpO2:  [93 %-100 %] 93 % (01/08 0528) Weight:  [93 kg] 93 kg (01/07 1657)  Intake/Output from previous day:  Intake/Output Summary (Last 24 hours) at 08/29/2024 0734 Last data filed at 08/29/2024 9357 Gross per 24 hour  Intake 5527.78 ml  Output 1750 ml  Net 3777.78 ml     Intake/Output this shift: No intake/output data recorded.  Labs: Recent Labs    08/26/24 0925 08/29/24 0409  HGB 9.8* 8.6*   Recent Labs    08/26/24 0925 08/29/24 0409  WBC 4.3 10.1  RBC 5.10 4.44  HCT 32.8* 28.2*  PLT 238 231   Recent Labs    08/26/24 0925 08/29/24 0409  NA 140 136  K 3.5 4.4  CL 103 102  CO2 26 28  BUN 28* 21  CREATININE 0.76 0.78  GLUCOSE 112* 127*  CALCIUM  9.5 8.7*   No results for input(s): LABPT, INR in the last 72 hours.  Exam: General - Patient is Alert and Oriented Extremity - Neurologically intact Neurovascular intact Sensation intact distally Dorsiflexion/Plantar flexion intact Dressing - dressing C/D/I Motor Function - intact, moving foot and toes well on exam.   Past Medical History:  Diagnosis Date   Arthritis    Hypertension    Obesity    Stroke (HCC)    Thalassemia, beta (HCC)     Assessment/Plan: 1 Day Post-Op Procedures (LRB): ARTHROPLASTY, HIP, TOTAL, ANTERIOR APPROACH (Right) Principal Problem:   OA (osteoarthritis) of hip Active Problems:   Primary osteoarthritis  of right hip  Estimated body mass index is 37.49 kg/m as calculated from the following:   Height as of this encounter: 5' 2 (1.575 m).   Weight as of this encounter: 93 kg. Advance diet Up with therapy D/C IV fluids  DVT Prophylaxis - Plavix  75mg  QD Weight bearing as tolerated. Continue therapy.  She has chronically low hemoglobin due to diagnosis of beta thalassemia, she regularly is around 9.5-9.8. We have seen a small drop in hemoglobin following surgery to 8.6 but patient is asymptomatic. We will continue to plan on DC today.    Plan is to go Home after hospital stay. Plan for discharge with HEP later today if progresses with therapy and meeting goals. Follow-up in the office in 2 weeks.  The PDMP database was reviewed today prior to any opioid medications being prescribed to this patient.  Waddell RONAL Sor, PA-C Orthopedic Surgery [phone] 08/29/2024, 7:34 AM  "

## 2024-08-29 NOTE — Care Management Obs Status (Signed)
 MEDICARE OBSERVATION STATUS NOTIFICATION   Patient Details  Name: Stacy Levy MRN: [id number] Date of Birth: April 05, 1932   Medicare Observation Status Notification Given:  Yes    Alfonse JONELLE Rex, RN 08/29/2024, 12:08 PM

## 2024-08-29 NOTE — TOC Transition Note (Signed)
 Transition of Care [facility]) - Discharge Note   Patient Details  Name: Stacy Levy MRN: [id number] Date of Birth: [birth year]  Transition of Care [facility]) CM/SW Contact:  Alfonse JONELLE Rex, RN Phone Number: 08/29/2024, 12:12 PM   Clinical Narrative:   Met with patient and niece at bedside to review dc therapy and equipment needs, niece confirmed HEP, pt has RW. MOON reviewed with niece, form signed, copy provided.     Final next level of care: Home/Self Care Barriers to Discharge: No Barriers Identified   Patient Goals and CMS Choice Patient states their goals for this hospitalization and ongoing recovery are:: return home          Discharge Placement                       Discharge Plan and Services Additional resources added to the After Visit Summary for                  DME Arranged: N/A DME Agency: NA                  Social Drivers of Health (SDOH) Interventions SDOH Screenings   Food Insecurity: No Food Insecurity (08/28/2024)  Housing: Low Risk (08/28/2024)  Transportation Needs: No Transportation Needs (08/28/2024)  Utilities: Not At Risk (08/28/2024)  Social Connections: Unknown (08/28/2024)  Tobacco Use: Unknown (08/28/2024)     Readmission Risk Interventions     No data to display

## 2024-08-29 NOTE — Care Management Obs Status (Incomplete)
 A MEDICARE OBSERVATION STATUS NOTIFICATION   Patient Details  Name: Stacy Levy MRN: [id number] Date of Birth: 20-Jun-1932   Medicare Observation Status Notification Given:  Yes    Alfonse JONELLE Rex, RN 08/29/2024, 12:09 PM

## 2024-08-29 NOTE — Plan of Care (Signed)
" °  Problem: Skin Integrity: Goal: Demonstrates signs of wound healing without infection Outcome: Progressing   Problem: Urinary Elimination: Goal: Will remain free from infection Outcome: Progressing Goal: Ability to achieve and maintain adequate urine output Outcome: Progressing   Problem: Activity: Goal: Risk for activity intolerance will decrease Outcome: Progressing   Problem: Nutrition: Goal: Adequate nutrition will be maintained Outcome: Progressing   Problem: Pain Managment: Goal: General experience of comfort will improve and/or be controlled Outcome: Progressing   Problem: Safety: Goal: Ability to remain free from injury will improve Outcome: Progressing   "

## 2024-08-30 NOTE — Discharge Summary (Signed)
 Patient ID: Stacy Levy MRN: [id number] DOB/AGE: [birth year] 89 y.o.  Admit date: 08/28/2024 Discharge date: 08/29/2024  Admission Diagnoses:  Principal Problem:   OA (osteoarthritis) of hip Active Problems:   Primary osteoarthritis of right hip   Discharge Diagnoses:  Same  Past Medical History:  Diagnosis Date   Arthritis    Hypertension    Obesity    Stroke (HCC)    Thalassemia, beta (HCC)     Surgeries: Procedures: ARTHROPLASTY, HIP, TOTAL, ANTERIOR APPROACH on 08/28/2024   Consultants:   Discharged Condition: Improved  Hospital Course: Inge Ruth Bungert is an 89 y.o. female who was admitted 08/28/2024 for operative treatment ofOA (osteoarthritis) of hip. Patient has severe unremitting pain that affects sleep, daily activities, and work/hobbies. After pre-op clearance the patient was taken to the operating room on 08/28/2024 and underwent  Procedures: ARTHROPLASTY, HIP, TOTAL, ANTERIOR APPROACH.    Patient was given perioperative antibiotics:  Anti-infectives (From admission, onward)    Start     Dose/Rate Route Frequency Ordered Stop   08/28/24 1700  ceFAZolin  (ANCEF ) IVPB 2g/100 mL premix        2 g 200 mL/hr over 30 Minutes Intravenous Every 6 hours 08/28/24 1617 08/29/24 1654   08/28/24 0830  ceFAZolin  (ANCEF ) IVPB 2g/100 mL premix        2 g 200 mL/hr over 30 Minutes Intravenous On call to O.R. 08/28/24 0824 08/28/24 1032        Patient was given sequential compression devices, early ambulation, and chemoprophylaxis to prevent DVT.  Patient benefited maximally from hospital stay and there were no complications.    Recent vital signs: Patient Vitals for the past 24 hrs:  BP Temp Temp src Pulse Resp SpO2  08/29/24 1358 (!) 112/53 98.3 F (36.8 C) Oral 70 18 97 %  08/29/24 1101 (!) 103/54 -- -- -- -- --     Recent laboratory studies:  Recent Labs    08/29/24 0409  WBC 10.1  HGB 8.6*  HCT 28.2*  PLT 231  NA 136  K 4.4  CL 102  CO2 28  BUN 21   CREATININE 0.78  GLUCOSE 127*  CALCIUM  8.7*     Discharge Medications:   Allergies as of 08/29/2024       Reactions   Aspirin Other (See Comments)   Upset stomach         Medication List     STOP taking these medications    TIGER BALM ARTHRITIS RUB EX       TAKE these medications    atorvastatin  40 MG tablet Commonly known as: LIPITOR Take 1 tablet (40 mg total) by mouth daily at 6 PM.   clopidogrel  75 MG tablet Commonly known as: PLAVIX  Take 1 tablet (75 mg total) by mouth daily.   FIBER GUMMIES PO Take 1 tablet by mouth daily.   HYDROcodone -acetaminophen  5-325 MG tablet Commonly known as: NORCO/VICODIN Take 1 tablet by mouth every 4 (four) hours as needed for severe pain (pain score 7-10).   methocarbamol  500 MG tablet Commonly known as: ROBAXIN  Take 1 tablet (500 mg total) by mouth every 6 (six) hours as needed for muscle spasms.   Myrbetriq  50 MG Tb24 tablet Generic drug: mirabegron  ER Take 50 mg by mouth daily.   ondansetron  4 MG tablet Commonly known as: ZOFRAN  Take 1 tablet (4 mg total) by mouth every 6 (six) hours as needed for nausea.   OVER THE COUNTER MEDICATION Take 1 tablet by mouth daily. Vitamin  D w/ Magnesium    traMADol  50 MG tablet Commonly known as: ULTRAM  Take 1 tablet (50 mg total) by mouth every 6 (six) hours as needed for moderate pain (pain score 4-6).   valsartan -hydrochlorothiazide  320-25 MG tablet Commonly known as: DIOVAN -HCT Take 1 tablet by mouth daily.               Discharge Care Instructions  (From admission, onward)           Start     Ordered   08/29/24 0000  Weight bearing as tolerated        08/29/24 0746   08/29/24 0000  Change dressing       Comments: You have an adhesive waterproof bandage over the incision. Leave this in place until your first follow-up appointment. Once you remove this you will not need to place another bandage.   08/29/24 0746            Diagnostic Studies: DG  Pelvis Portable Result Date: 08/28/2024 CLINICAL DATA:  Status post right hip replacement. EXAM: PORTABLE PELVIS 1-2 VIEWS COMPARISON:  None Available. FINDINGS: The right femoral and acetabular components are well situated. Expected postoperative changes seen in the surrounding soft tissues. IMPRESSION: Status post right total hip arthroplasty. Electronically Signed   By: Lynwood Landy Raddle M.D.   On: 08/28/2024 15:46   DG HIP UNILAT WITH PELVIS 1V RIGHT Result Date: 08/28/2024 CLINICAL DATA:  Status post right hip arthroplasty. EXAM: DG HIP (WITH OR WITHOUT PELVIS) 1V RIGHT; DG C-ARM 1-60 MIN-NO REPORT Radiation exposure index: 0.8788 mGy. COMPARISON:  July 01, 2016. FINDINGS: Eight intraoperative fluoroscopic images were obtained of the right hip. Right femoral and acetabular components appear to be well situated. IMPRESSION: Fluoroscopic guidance provided during right total hip arthroplasty. Electronically Signed   By: Lynwood Landy Raddle M.D.   On: 08/28/2024 15:45   DG C-Arm 1-60 Min-No Report Result Date: 08/28/2024 Fluoroscopy was utilized by the requesting physician.  No radiographic interpretation.   DG C-Arm 1-60 Min-No Report Result Date: 08/28/2024 Fluoroscopy was utilized by the requesting physician.  No radiographic interpretation.    Disposition: Discharge disposition: 01-Home or Self Care       Discharge Instructions     Call MD / Call 911   Complete by: As directed    If you experience chest pain or shortness of breath, CALL 911 and be transported to the hospital emergency room.  If you develope a fever above 101 F, pus (white drainage) or increased drainage or redness at the wound, or calf pain, call your surgeon's office.   Change dressing   Complete by: As directed    You have an adhesive waterproof bandage over the incision. Leave this in place until your first follow-up appointment. Once you remove this you will not need to place another bandage.   Constipation Prevention    Complete by: As directed    Drink plenty of fluids.  Prune juice may be helpful.  You may use a stool softener, such as Colace (over the counter) 100 mg twice a day.  Use MiraLax  (over the counter) for constipation as needed.   Diet - low sodium heart healthy   Complete by: As directed    Do not sit on low chairs, stoools or toilet seats, as it may be difficult to get up from low surfaces   Complete by: As directed    Driving restrictions   Complete by: As directed    No driving for two  weeks   Post-operative opioid taper instructions:   Complete by: As directed    POST-OPERATIVE OPIOID TAPER INSTRUCTIONS: It is important to wean off of your opioid medication as soon as possible. If you do not need pain medication after your surgery it is ok to stop day one. Opioids include: Codeine, Hydrocodone (Norco, Vicodin), Oxycodone (Percocet, oxycontin ) and hydromorphone amongst others.  Long term and even short term use of opiods can cause: Increased pain response Dependence Constipation Depression Respiratory depression And more.  Withdrawal symptoms can include Flu like symptoms Nausea, vomiting And more Techniques to manage these symptoms Hydrate well Eat regular healthy meals Stay active Use relaxation techniques(deep breathing, meditating, yoga) Do Not substitute Alcohol to help with tapering If you have been on opioids for less than two weeks and do not have pain than it is ok to stop all together.  Plan to wean off of opioids This plan should start within one week post op of your joint replacement. Maintain the same interval or time between taking each dose and first decrease the dose.  Cut the total daily intake of opioids by one tablet each day Next start to increase the time between doses. The last dose that should be eliminated is the evening dose.      TED hose   Complete by: As directed    Use stockings (TED hose) for three weeks on both leg(s).  You may remove them at  night for sleeping.   Weight bearing as tolerated   Complete by: As directed         Follow-up Information     Aluisio, Dempsey, MD. Schedule an appointment as soon as possible for a visit in 2 week(s).   Specialty: Orthopedic Surgery Contact information: [address] [place] [address] [phone]                  Signed: Waddell DELENA Sor 08/30/2024, 7:28 AM
